# Patient Record
Sex: Male | Born: 1970 | Race: White | Hispanic: No | Marital: Married | Smoking: Never smoker
Health system: Southern US, Community
[De-identification: ages and names within clinical notes are randomized; demographics above are authoritative.]

## PROBLEM LIST (undated history)

## (undated) DIAGNOSIS — I780 Hereditary hemorrhagic telangiectasia: Secondary | ICD-10-CM

## (undated) HISTORY — DX: Hereditary hemorrhagic telangiectasia: I78.0

## (undated) HISTORY — PX: LUNG SURGERY: SHX703

## (undated) HISTORY — PX: BRAIN SURGERY: SHX531

---

## 2000-10-27 ENCOUNTER — Ambulatory Visit (HOSPITAL_BASED_OUTPATIENT_CLINIC_OR_DEPARTMENT_OTHER): Admission: RE | Admit: 2000-10-27 | Discharge: 2000-10-27 | Payer: Self-pay | Admitting: *Deleted

## 2003-05-22 ENCOUNTER — Encounter: Admission: RE | Admit: 2003-05-22 | Discharge: 2003-05-22 | Payer: Self-pay | Admitting: *Deleted

## 2003-06-06 ENCOUNTER — Encounter: Admission: RE | Admit: 2003-06-06 | Discharge: 2003-06-06 | Payer: Self-pay | Admitting: *Deleted

## 2003-07-12 ENCOUNTER — Encounter: Admission: RE | Admit: 2003-07-12 | Discharge: 2003-07-12 | Payer: Self-pay | Admitting: *Deleted

## 2003-10-31 ENCOUNTER — Ambulatory Visit (HOSPITAL_COMMUNITY): Admission: RE | Admit: 2003-10-31 | Discharge: 2003-11-01 | Payer: Self-pay | Admitting: Orthopaedic Surgery

## 2003-12-24 ENCOUNTER — Ambulatory Visit (HOSPITAL_COMMUNITY): Admission: RE | Admit: 2003-12-24 | Discharge: 2003-12-24 | Payer: Self-pay | Admitting: Family Medicine

## 2004-04-03 ENCOUNTER — Inpatient Hospital Stay (HOSPITAL_COMMUNITY): Admission: RE | Admit: 2004-04-03 | Discharge: 2004-04-09 | Payer: Self-pay | Admitting: Thoracic Surgery

## 2004-04-15 ENCOUNTER — Encounter: Admission: RE | Admit: 2004-04-15 | Discharge: 2004-04-15 | Payer: Self-pay | Admitting: Thoracic Surgery

## 2004-04-30 ENCOUNTER — Encounter: Admission: RE | Admit: 2004-04-30 | Discharge: 2004-04-30 | Payer: Self-pay | Admitting: Thoracic Surgery

## 2004-06-11 ENCOUNTER — Encounter: Admission: RE | Admit: 2004-06-11 | Discharge: 2004-06-11 | Payer: Self-pay | Admitting: Thoracic Surgery

## 2005-10-14 ENCOUNTER — Encounter: Admission: RE | Admit: 2005-10-14 | Discharge: 2005-10-14 | Payer: Self-pay | Admitting: Orthopaedic Surgery

## 2005-11-05 ENCOUNTER — Encounter: Admission: RE | Admit: 2005-11-05 | Discharge: 2005-11-05 | Payer: Self-pay | Admitting: Orthopaedic Surgery

## 2010-10-15 ENCOUNTER — Encounter: Payer: Self-pay | Admitting: Oncology

## 2011-06-18 DIAGNOSIS — Z8774 Personal history of (corrected) congenital malformations of heart and circulatory system: Secondary | ICD-10-CM | POA: Insufficient documentation

## 2012-02-24 ENCOUNTER — Ambulatory Visit (HOSPITAL_COMMUNITY)
Admission: RE | Admit: 2012-02-24 | Discharge: 2012-02-24 | Disposition: A | Discharge status: Home / Self Care | Payer: BC Managed Care – PPO | Source: Ambulatory Visit | Attending: Family Medicine | Admitting: Family Medicine

## 2012-02-24 ENCOUNTER — Ambulatory Visit (INDEPENDENT_AMBULATORY_CARE_PROVIDER_SITE_OTHER): Payer: BC Managed Care – PPO | Admitting: Family Medicine

## 2012-02-24 VITALS — BP 141/89 | HR 74 | Temp 98.5°F | Resp 16 | Ht 71.0 in | Wt 223.6 lb

## 2012-02-24 DIAGNOSIS — R51 Headache: Secondary | ICD-10-CM | POA: Insufficient documentation

## 2012-02-24 DIAGNOSIS — G510 Bell's palsy: Secondary | ICD-10-CM | POA: Insufficient documentation

## 2012-02-24 DIAGNOSIS — I780 Hereditary hemorrhagic telangiectasia: Secondary | ICD-10-CM | POA: Insufficient documentation

## 2012-02-24 DIAGNOSIS — I10 Essential (primary) hypertension: Secondary | ICD-10-CM

## 2012-02-24 MED ORDER — PREDNISONE 20 MG PO TABS: 80.0000 mg | ORAL_TABLET | Freq: Every day | ORAL | Status: DC

## 2012-02-24 MED ORDER — LACRI-LUBE S.O.P. OP OINT: TOPICAL_OINTMENT | OPHTHALMIC | Status: DC

## 2012-02-24 MED ORDER — VALACYCLOVIR HCL 1 G PO TABS: 1000.0000 mg | ORAL_TABLET | Freq: Three times a day (TID) | ORAL | Status: DC

## 2012-02-24 NOTE — Progress Notes (Signed)
Subjective:    Patient ID: Marcus Ball, male    DOB: [birth year], 41 y.o.   MRN: [id number]  HPI  Pt brought his daughter in today for eval.  While he was in the waiting room, he and his daughter started to notice that the right side of his face felt funny and his mouth was drooping. He was concerned he could be having an allergic rxn to something. He has no known allergies, has not taken any medicines today, has not tried any new or different foods today.  Unsure if change in taste - tongue feels odd. Has no h/o bell's palsy. 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 Yest he did have a recurrent spasm of his left eyelid and has a canker sore dev yest as well.  Soc Hx: Pt currently trying for IVF cycle w/ his wife Review of Systems  Constitutional: Negative.   HENT: Positive for mouth sores. Negative for ear pain, congestion, sore throat, facial swelling, trouble swallowing, neck pain, neck stiffness and voice change.   Eyes: Negative for photophobia, pain, discharge, redness, itching and visual disturbance.  Respiratory: Negative for shortness of breath.   Cardiovascular: Negative for chest pain.  Musculoskeletal: Negative for gait problem.  Neurological: Positive for facial asymmetry and numbness. Negative for dizziness, tremors, seizures, syncope, speech difficulty, light-headedness and headaches.  Hematological: Negative for adenopathy.      BP 141/89  Pulse 74  Temp 98.5 F (36.9 C) (Oral)  Resp 16  Ht 5\' 11"  (1.803 m)  Wt 223 lb 9.6 oz (101.424 kg)  BMI 31.19 kg/m2  SpO2 99%  Objective:   Physical Exam  Constitutional: He is oriented to person, place, and time. He appears well-developed and well-nourished. No distress.  HENT:  Head: Normocephalic and atraumatic.  Right Ear: External ear normal.  Left Ear: External ear normal.  Nose: Nose normal.  Mouth/Throat: Oropharynx is clear and  moist. No oropharyngeal exudate.  Eyes: Conjunctivae normal and EOM are normal. Pupils are equal, round, and reactive to light. Right eye exhibits no discharge. Left eye exhibits no discharge. No scleral icterus.  Neck: Normal range of motion. Neck supple. No thyromegaly present.  Cardiovascular: Normal rate, regular rhythm and normal heart sounds.   Pulmonary/Chest: Effort normal and breath sounds normal. No respiratory distress.  Lymphadenopathy:    He has no cervical adenopathy.  Neurological: He is alert and oriented to person, place, and time. He has normal strength. A cranial nerve deficit is present. Gait normal.       Pt having partial weakness and slight loss of sensation throughout all 3 branches of right trigeminal nerve - decreased forehead rise, can close eyelid completely but weak, right mouth drop.  No extremity weakness or numbness.  Skin: Skin is warm and dry. No rash noted. He is not diaphoretic.  Psychiatric: He has a normal mood and affect. His behavior is normal.          Assessment & Plan:  1. Bell's palsy - start high dose steroid and valtrex. Reviewed importance of appropriate eye care w/ artificial tears and taping.  Gave pt info on UTD handout. 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 Pt is leaving on vacation Fri, to return on Mon - flying, ok to go, RTC for repeat eval on Tues (6d).

## 2012-03-06 ENCOUNTER — Ambulatory Visit (INDEPENDENT_AMBULATORY_CARE_PROVIDER_SITE_OTHER): Payer: BC Managed Care – PPO | Admitting: Family Medicine

## 2012-03-06 VITALS — BP 126/83 | HR 82 | Temp 98.0°F | Resp 17 | Ht 71.0 in | Wt 219.0 lb

## 2012-03-06 DIAGNOSIS — G510 Bell's palsy: Secondary | ICD-10-CM

## 2012-03-06 DIAGNOSIS — R51 Headache: Secondary | ICD-10-CM

## 2012-03-06 DIAGNOSIS — J329 Chronic sinusitis, unspecified: Secondary | ICD-10-CM

## 2012-03-06 MED ORDER — CLARITHROMYCIN 500 MG PO TABS: 500.0000 mg | ORAL_TABLET | Freq: Two times a day (BID) | ORAL | Status: DC

## 2012-03-06 NOTE — Patient Instructions (Addendum)
Take the antibiotic for any tooth or sinus infection.  Please call your dentist tomorrow and ask for an evaluation asap.  If you are not better in the next few days please call or come back- Sooner if worse.

## 2012-03-06 NOTE — Progress Notes (Signed)
[facility] [address] [place] [address] [phone]  Date:  03/06/2012   Name:  Marcus Ball   DOB:  Dec 09, 1970   MRN:  [id number]  PCP:  No primary provider on file.    Chief Complaint: Follow-up and Facial Pain   History of Present Illness:  DENSON NICCOLI is a 41 y.o. very pleasant male patient who presents with the following:  He was seen here on 02/24/12 with drooping and sensation change on the right side of his face.    He has a history of surgery for brain and pulmonary AVM (herediatry hemorrhagic telangectasia).  He was started on steroids and valtrex at his last visit for Bell's palsy.  He also had a CT of his head:  CT HEAD WITHOUT CONTRAST  Technique: Contiguous axial images were obtained from the base of  the skull through the vertex without contrast.  Comparison: None.  Findings: There has been previous occipital craniotomy. There is  mild atrophy of the peripheral cerebellum on the right. No sign of  acute infarction, mass lesion, hemorrhage, hydrocephalus or extra-  axial collection. Sinuses, middle ears and mastoids are clear.  IMPRESSION:  Previous surgery in the right posterior fossa. Minimal atrophy of  the peripheral right cerebellum. Otherwise, the examination  appears normal. No acute finding.   He is here today to follow-up.  He notes that he developed pain behind his right ear in in his right cheek over the last couple of days.  It hurts to lie on the right side of his face.  It does not hurt to bend down.  It feels like an "ache."  He has completed the steroids- took for one week.  He finished the steroids 4 days ago.    He continues to have drooping and weakness in the right side of his face.  He is able to eat.  His eye is ok- he uses moisturizing ointment especially at night. No changes in his vision.  He has a mild ST, but no fever, no chills, no cough.  No GI symptoms.  He has been taking tylenol.  He does  see his dentist regularly but has not been in about one year Patient Active Problem List  Diagnosis  . Bell's palsy  . Osler-Weber-Rendu syndrome    No past medical history on file.  No past surgical history on file.  History  Substance Use Topics  . Smoking status: Never Smoker   . Smokeless tobacco: Not on file  . Alcohol Use: Not on file    No family history on file.  Allergies  Allergen Reactions  . Penicillins Hives  . Sulfa Antibiotics Hives    Medication list has been reviewed and updated.  Current Outpatient Prescriptions on File Prior to Visit  Medication Sig Dispense Refill  . Artificial Tear Ointment (LACRI-LUBE S.O.P.) OINT 1 application to eye q1 hrs prn  2 Tube  5  . allopurinol (ZYLOPRIM) 100 MG tablet Take 100 mg by mouth daily.      . predniSONE (DELTASONE) 20 MG tablet Take 4 tablets (80 mg total) by mouth daily.  28 tablet  0  . valACYclovir (VALTREX) 1000 MG tablet Take 1 tablet (1,000 mg total) by mouth 3 (three) times daily.  21 tablet  0    Review of Systems:  As per HPI- otherwise negative.   Physical Examination: Filed Vitals:   03/06/12 0930  BP: 126/83  Pulse: 82  Temp: 98  F (36.7 C)  Resp: 17   Filed Vitals:   03/06/12 0930  Height: 5\' 11"  (1.803 m)  Weight: 219 lb (99.338 kg)   Body mass index is 30.54 kg/(m^2). Ideal Body Weight: Weight in (lb) to have BMI = 25: 178.9   GEN: WDWN, NAD, Non-toxic, A & O x 3 HEENT: Atraumatic, Normocephalic. Neck supple. No masses, No LAD. Bilateral TM wnl, oropharynx normal.  PEERL,EOMI.   Drooping and weakness of the right side of the face including the forehead.  Not able to completely close the eye.  No rash or lesion.  Right cheek appears swollen, although facial asymmetry makes this determination more difficult.  He is tender over his anterior right cheekbone.  No specific dental tenderness  Ears and Nose: No external deformity. CV: RRR, No M/G/R. No JVD. No thrill. No extra heart  sounds. PULM: CTA B, no wheezes, crackles, rhonchi. No retractions. No resp. distress. No accessory muscle use EXTR: No c/c/e NEURO Normal gait.  PSYCH: Normally interactive. Conversant. Not depressed or anxious appearing.  Calm demeanor.    Assessment and Plan: 1. Facial pain  clarithromycin (BIAXIN) 500 MG tablet  2. Sinusitis  clarithromycin (BIAXIN) 500 MG tablet  3. Bell's palsy     Chrstopher has Bell's Palsy.  He is not improving yet, but is hopeful that he will be better soon.  Today's issue actually seems to be unrelated, and I am suspicious of a dental problem. He has tenderness and swelling of his cheek that suggests a dental infection.  Will treat with biaxin, and he will see is dentist asap.  If he is cleared by his dentist and not better he is to call me right away  Meds ordered this encounter  Medications  . clarithromycin (BIAXIN) 500 MG tablet    Sig: Take 1 tablet (500 mg total) by mouth 2 (two) times daily.    Dispense:  20 tablet    Refill:  0     COPLAND,JESSICA, MD

## 2012-10-30 IMAGING — CT CT HEAD W/O CM
1 series · 16 of 30 positions shown, 20 images · non-contrast
Comparison: None.

CLINICAL DATA: Headache.  Right-sided Bell's palsy.  Previous
surgery for arteriovenous malformation.

CT HEAD WITHOUT CONTRAST
TECHNIQUE: Contiguous axial images were obtained from the base of
the skull through the vertex without contrast.

[Series 2: head routine 4.8 h37s · axial · 0.50mm/px · z∈[+1099,+1230]mm · 16 of 30 slices shown, 20 images]
[im 2/30  brain]
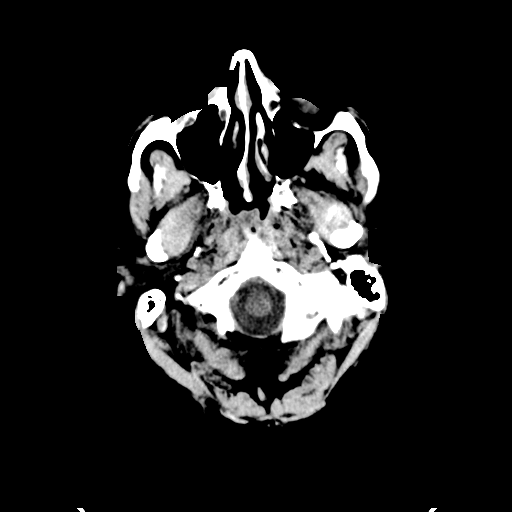
[im 2/30  bone]
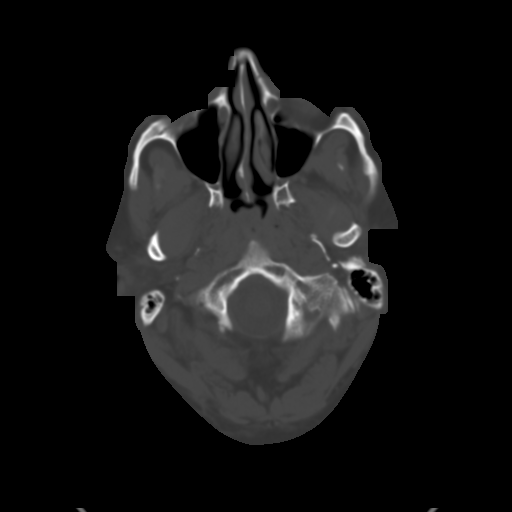
[im 4/30  brain]
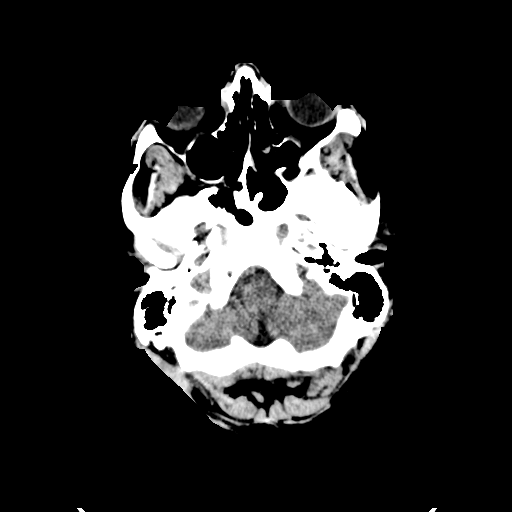
[im 6/30  brain]
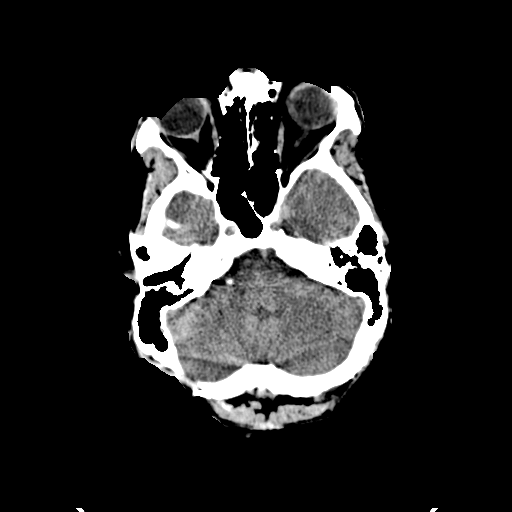
[im 8/30  brain]
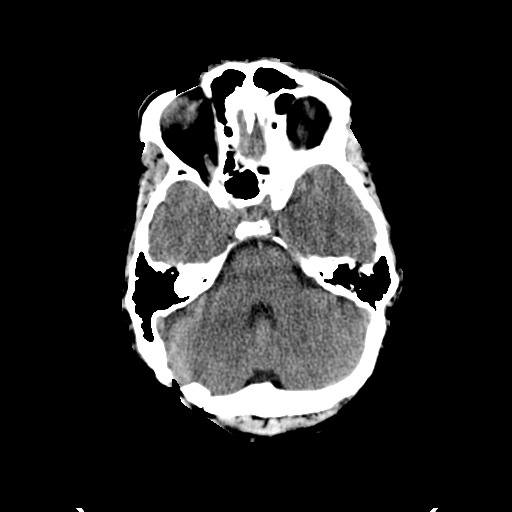
[im 9/30  brain]
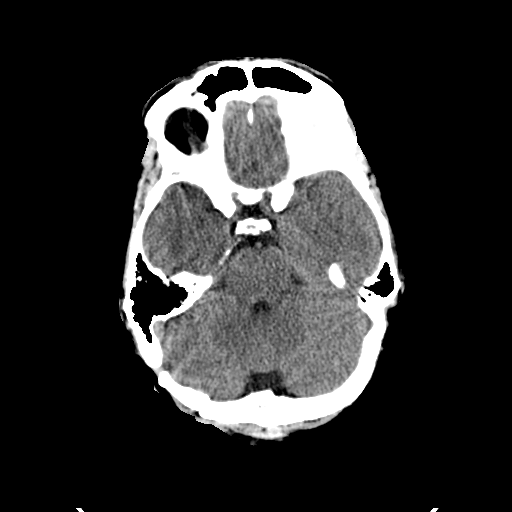
[im 9/30  bone]
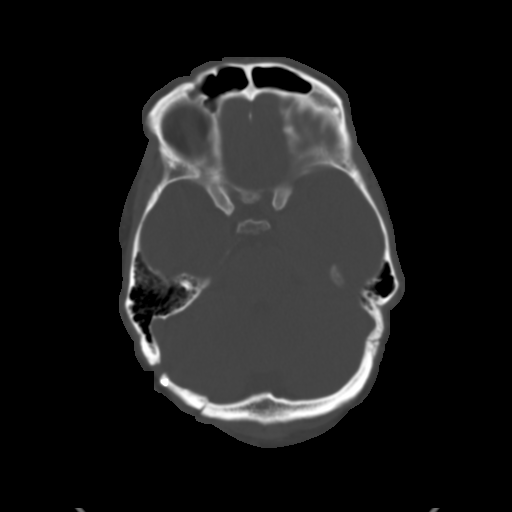
[im 11/30  brain]
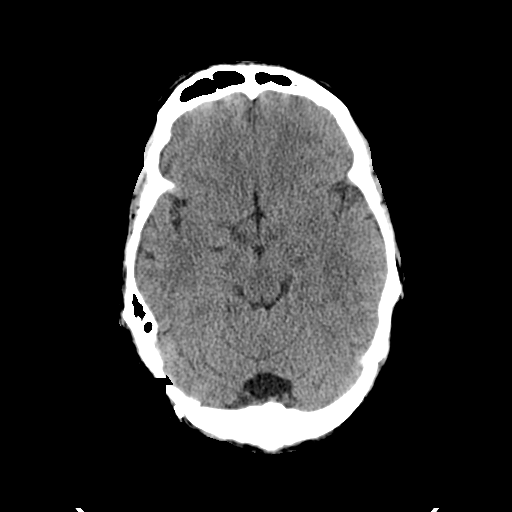
[im 13/30  brain]
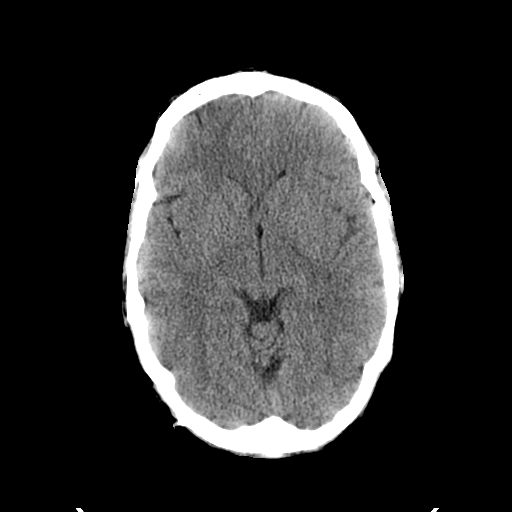
[im 15/30  brain]
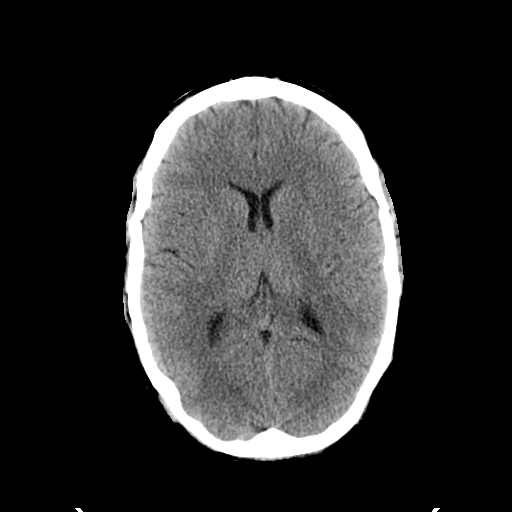
[im 16/30  brain]
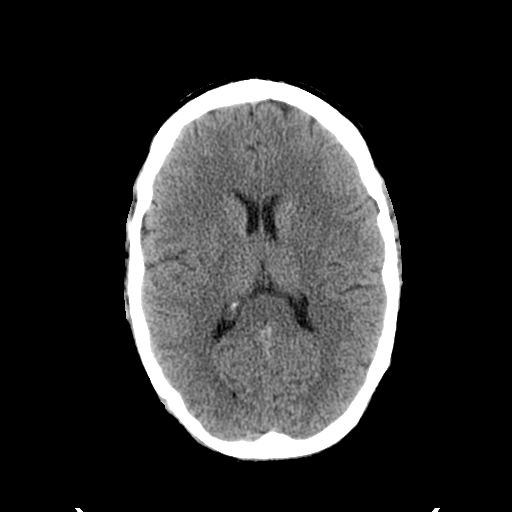
[im 16/30  bone]
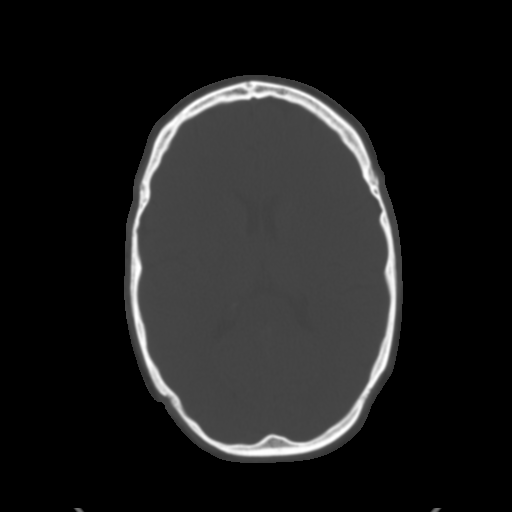
[im 18/30  brain]
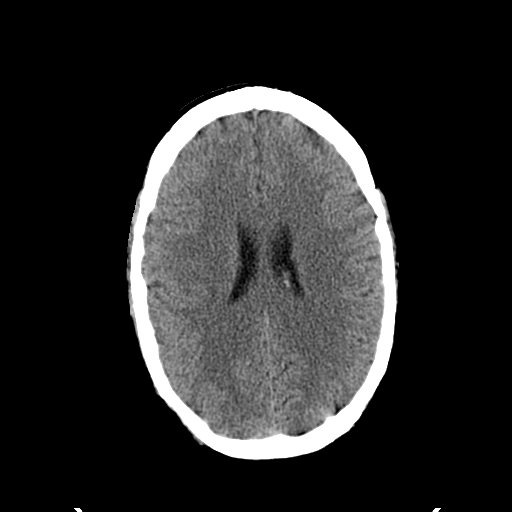
[im 20/30  brain]
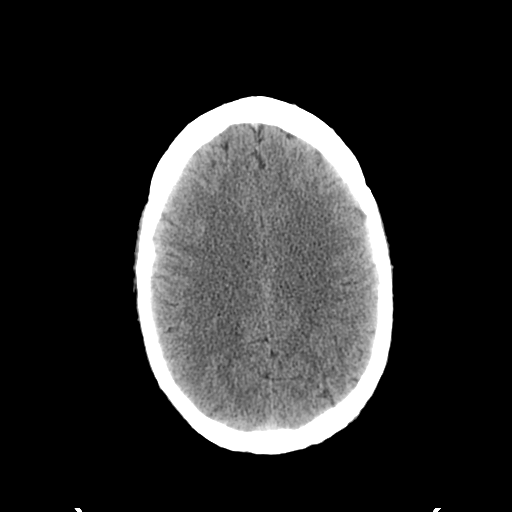
[im 22/30  brain]
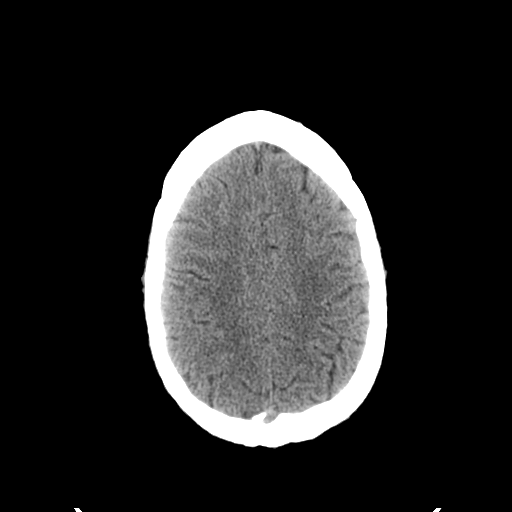
[im 23/30  brain]
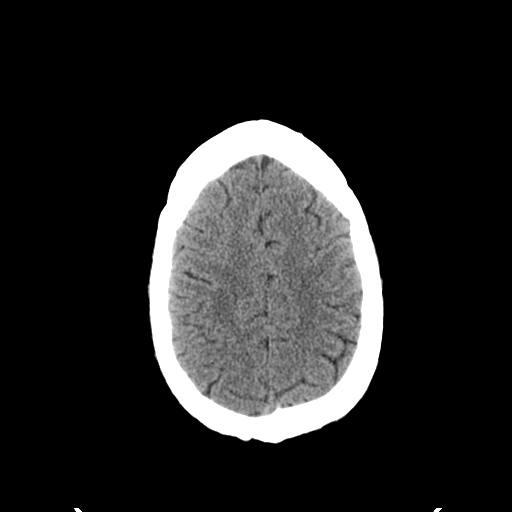
[im 23/30  bone]
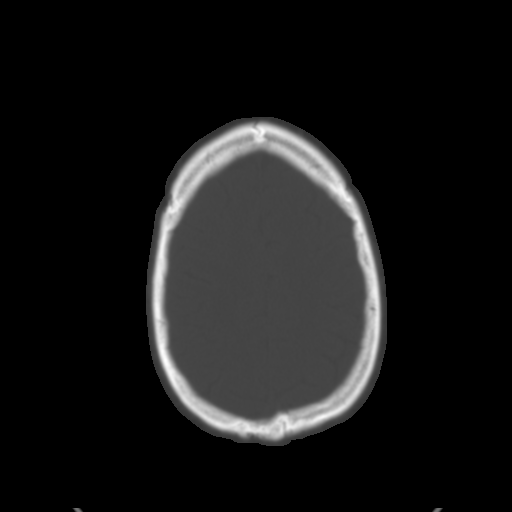
[im 25/30  brain]
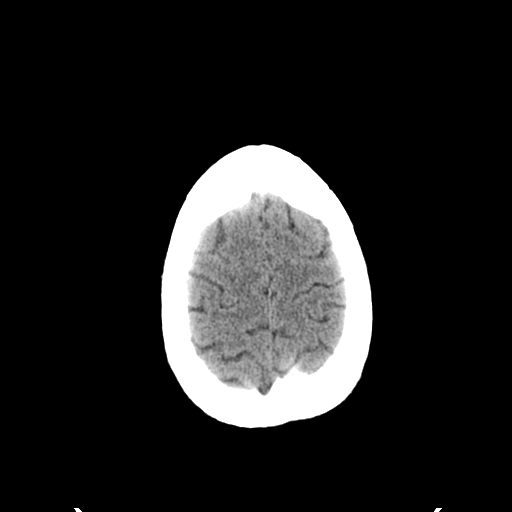
[im 27/30  brain]
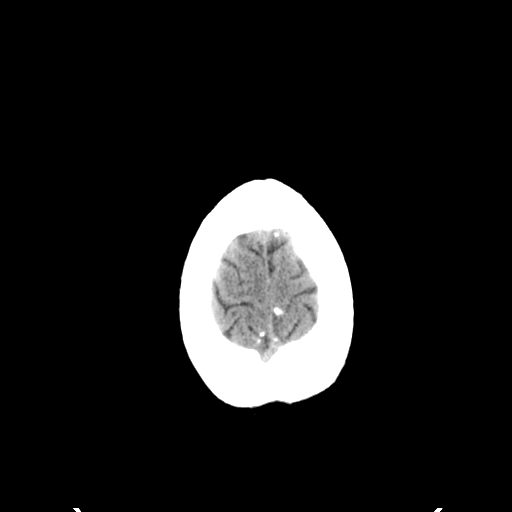
[im 29/30  brain]
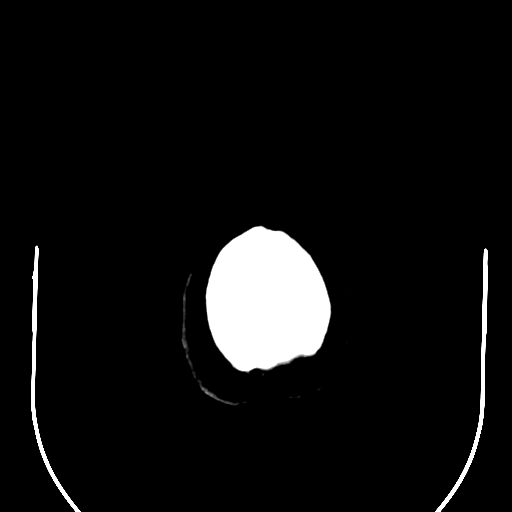

[16 of 30 positions shown; findings below may reference images not displayed]

FINDINGS: There has been previous occipital craniotomy.  There is
mild atrophy of the peripheral cerebellum on the right.  No sign of
acute infarction, mass lesion, hemorrhage, hydrocephalus or extra-
axial collection.  Sinuses, middle ears and mastoids are clear.
IMPRESSION: Previous surgery in the right posterior fossa.  Minimal atrophy of
the peripheral right cerebellum.  Otherwise, the examination
appears normal.  No acute finding.

## 2013-04-17 ENCOUNTER — Ambulatory Visit (INDEPENDENT_AMBULATORY_CARE_PROVIDER_SITE_OTHER): Payer: BC Managed Care – PPO | Admitting: Physician Assistant

## 2013-04-17 VITALS — BP 110/76 | HR 101 | Temp 100.3°F | Resp 18 | Ht 71.0 in | Wt 216.0 lb

## 2013-04-17 DIAGNOSIS — R509 Fever, unspecified: Secondary | ICD-10-CM

## 2013-04-17 DIAGNOSIS — R05 Cough: Secondary | ICD-10-CM

## 2013-04-17 DIAGNOSIS — J111 Influenza due to unidentified influenza virus with other respiratory manifestations: Secondary | ICD-10-CM

## 2013-04-17 DIAGNOSIS — J101 Influenza due to other identified influenza virus with other respiratory manifestations: Secondary | ICD-10-CM

## 2013-04-17 DIAGNOSIS — R059 Cough, unspecified: Secondary | ICD-10-CM

## 2013-04-17 LAB — POCT INFLUENZA A/B
Influenza A, POC: POSITIVE
Influenza B, POC: NEGATIVE

## 2013-04-17 MED ORDER — HYDROCODONE-HOMATROPINE 5-1.5 MG/5ML PO SYRP: 5.0000 mL | ORAL_SOLUTION | Freq: Three times a day (TID) | ORAL | Status: DC | PRN

## 2013-04-17 MED ORDER — OSELTAMIVIR PHOSPHATE 75 MG PO CAPS: 75.0000 mg | ORAL_CAPSULE | Freq: Two times a day (BID) | ORAL | Status: DC

## 2013-04-17 NOTE — Progress Notes (Signed)
   Subjective:    Patient ID: Marcus Ball, male    DOB: [birth year], 42 y.o.   MRN: [id number]  Fever  Associated symptoms include congestion, coughing, headaches and a sore throat. Pertinent negatives include no abdominal pain, nausea, vomiting or wheezing.  Sore Throat  Associated symptoms include congestion, coughing and headaches. Pertinent negatives include no abdominal pain, shortness of breath or vomiting.  Sinus Problem Associated symptoms include chills, congestion, coughing, headaches and a sore throat. Pertinent negatives include no shortness of breath or sinus pressure.   42 year old male presents for evaluation of nasal congestion, sore throat, cough, fevers, body aches, and headache.  Symptoms started suddenly on 04/15/13.  Admits his daughter and wife both have similar illnesses but have not been tested for the flu. He has been taking tylenol and robitussin which helps some.  Denies SOB, wheezing, nausea, vomiting, otalgia, sinus pain, or abdominal pain.  He is concerned because he has an 64 week old baby at home.  Did have his flu shot 2 months ago.     Review of Systems  Constitutional: Positive for fever, chills and fatigue.  HENT: Positive for congestion, rhinorrhea and sore throat. Negative for sinus pressure.   Respiratory: Positive for cough. Negative for shortness of breath and wheezing.   Gastrointestinal: Negative for nausea, vomiting and abdominal pain.  Neurological: Positive for headaches.       Objective:   Physical Exam  Constitutional: He is oriented to person, place, and time. He appears well-developed and well-nourished.  HENT:  Head: Normocephalic and atraumatic.  Right Ear: Hearing, tympanic membrane, external ear and ear canal normal.  Left Ear: Hearing, tympanic membrane, external ear and ear canal normal.  Mouth/Throat: Uvula is midline, oropharynx is clear and moist and mucous membranes are normal.  Eyes: Conjunctivae are normal.    Cardiovascular: Normal rate, regular rhythm and normal heart sounds.   Pulmonary/Chest: Effort normal and breath sounds normal.  Neurological: He is alert and oriented to person, place, and time.  Psychiatric: He has a normal mood and affect. His behavior is normal. Judgment and thought content normal.     Results for orders placed in visit on 04/17/13  POCT INFLUENZA A/B      Result Value Range   Influenza A, POC Positive     Influenza B, POC Negative          Assessment & Plan:  Fever, unspecified - Plan: POCT Influenza A/B, oseltamivir (TAMIFLU) 75 MG capsule  Cough - Plan: HYDROcodone-homatropine (HYCODAN) 5-1.5 MG/5ML syrup  Influenza A - Plan: oseltamivir (TAMIFLU) 75 MG capsule  Even though he has been symptomatic for just over 48 hours, will go ahead and start Tamiflu Tamiflu 75 mg bid x 5 days Good handwashing and wear mask at home Follow up if symptoms worsen or fail to improve.

## 2013-04-27 ENCOUNTER — Telehealth: Payer: Self-pay

## 2013-04-27 NOTE — Telephone Encounter (Signed)
I am sorry, but I cannot call in an antibiotic without evaluating the patient. I would send this to the provider if she was in town, however she is not so we must go by what is safest for the patient which is not just blindly throw medication as someone without evaluating them.

## 2013-04-27 NOTE — Telephone Encounter (Signed)
Thank you, I agree, advised patient to return to clinic.

## 2013-04-27 NOTE — Telephone Encounter (Signed)
Note indicates follow up is needed if not improving, but patient states he was advised to call if he gets sinus symptoms, which he has. Please advise.

## 2013-04-27 NOTE — Telephone Encounter (Signed)
Patient was seen here and was told he would probably develop a sinus infection.  Which he did and is requesting meds to clear it up.   [facility] - [address]    [phone]

## 2013-06-02 ENCOUNTER — Ambulatory Visit (INDEPENDENT_AMBULATORY_CARE_PROVIDER_SITE_OTHER): Payer: BC Managed Care – PPO | Admitting: Family Medicine

## 2013-06-02 VITALS — BP 110/72 | HR 86 | Temp 101.9°F | Resp 16 | Ht 72.0 in | Wt 224.0 lb

## 2013-06-02 DIAGNOSIS — D72829 Elevated white blood cell count, unspecified: Secondary | ICD-10-CM

## 2013-06-02 DIAGNOSIS — J029 Acute pharyngitis, unspecified: Secondary | ICD-10-CM

## 2013-06-02 DIAGNOSIS — R059 Cough, unspecified: Secondary | ICD-10-CM

## 2013-06-02 DIAGNOSIS — R05 Cough: Secondary | ICD-10-CM

## 2013-06-02 DIAGNOSIS — R6889 Other general symptoms and signs: Secondary | ICD-10-CM

## 2013-06-02 LAB — POCT CBC
GRANULOCYTE PERCENT: 87.5 % — AB (ref 37–80)
HCT, POC: 45.4 % (ref 43.5–53.7)
Hemoglobin: 14.7 g/dL (ref 14.1–18.1)
LYMPH, POC: 1.3 (ref 0.6–3.4)
MCH: 28.5 pg (ref 27–31.2)
MCHC: 32.4 g/dL (ref 31.8–35.4)
MCV: 88.2 fL (ref 80–97)
MID (CBC): 1 — AB (ref 0–0.9)
MPV: 11.2 fL (ref 0–99.8)
PLATELET COUNT, POC: 182 10*3/uL (ref 142–424)
POC Granulocyte: 16.3 — AB (ref 2–6.9)
POC LYMPH %: 7 % — AB (ref 10–50)
POC MID %: 5.5 % (ref 0–12)
RBC: 5.15 M/uL (ref 4.69–6.13)
RDW, POC: 13.7 %
WBC: 18.6 10*3/uL — AB (ref 4.6–10.2)

## 2013-06-02 LAB — POCT INFLUENZA A/B
INFLUENZA A, POC: NEGATIVE
INFLUENZA B, POC: NEGATIVE

## 2013-06-02 LAB — POCT RAPID STREP A (OFFICE): Rapid Strep A Screen: NEGATIVE

## 2013-06-02 MED ORDER — AZITHROMYCIN 250 MG PO TABS: ORAL_TABLET | ORAL | Status: AC

## 2013-06-02 MED ORDER — GUAIFENESIN ER 1200 MG PO TB12: 1.0000 | ORAL_TABLET | Freq: Two times a day (BID) | ORAL | Status: AC

## 2013-06-02 NOTE — Progress Notes (Signed)
Subjective:    Patient ID: Marcus Ball, male    DOB: 07-13-1970, 43 y.o.   MRN: [id number]  HPI Pt presents to clinic with 24hs of sore throat and high fevers with headaches and myalgias.  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  He had the flu in December and this feels just like it.  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  HENT: Positive for congestion, postnasal drip, rhinorrhea (clear) and sore throat.   Respiratory: Positive for cough (minimal - dry). Negative for shortness of breath and wheezing.   Musculoskeletal: Positive for myalgias.  Neurological: Positive for headaches.       Objective:   Physical Exam  Vitals reviewed. Constitutional: He is oriented to person, place, and time. He appears well-developed and well-nourished.  HENT:  Head: Normocephalic and atraumatic.  Right Ear: Hearing, tympanic membrane, external ear and ear canal normal.  Left Ear: Hearing, tympanic membrane, external ear and ear canal normal.  Nose: Nose normal.  Mouth/Throat: Uvula is midline, oropharynx is clear and moist and mucous membranes are normal.  Eyes: Conjunctivae are normal.  Neck: Normal range of motion.  Lymphadenopathy:    He has cervical adenopathy (AC enlarged and TTP).  Neurological: He is alert and oriented to person, place, and time.  Skin: Skin is warm and dry.  Psychiatric: He has a normal mood and affect. His behavior is normal. Judgment and thought content normal.   Results for orders placed in visit on 06/02/13  POCT INFLUENZA A/B      Result Value Range   Influenza A, POC Negative     Influenza B, POC Negative    POCT CBC      Result Value Range   WBC 18.6 (*) 4.6 - 10.2 K/uL   Lymph, poc 1.3  0.6 - 3.4   POC LYMPH PERCENT 7.0 (*)  10 - 50 %L   MID (cbc) 1.0 (*) 0 - 0.9   POC MID % 5.5  0 - 12 %M   POC Granulocyte 16.3 (*) 2 - 6.9   Granulocyte percent 87.5 (*) 37 - 80 %G   RBC 5.15  4.69 - 6.13 M/uL   Hemoglobin 14.7  14.1 - 18.1 g/dL   HCT, POC 40.9  81.1 - 53.7 %   MCV 88.2  80 - 97 fL   MCH, POC 28.5  27 - 31.2 pg   MCHC 32.4  31.8 - 35.4 g/dL   RDW, POC 91.4     Platelet Count, POC 182  142 - 424 K/uL   MPV 11.2  0 - 99.8 fL  POCT RAPID STREP A (OFFICE)      Result Value Range   Rapid Strep A Screen Negative  Negative         Assessment & Plan:  Flu-like symptoms - Plan: POCT Influenza A/B, POCT CBC, POCT rapid strep A  Sore throat - Plan: Culture, Group A Strep, azithromycin (ZITHROMAX Z-PAK) 250 MG tablet  Leukocytosis, unspecified - Plan: Culture, Group A Strep, azithromycin (ZITHROMAX Z-PAK) 250 MG tablet, Guaifenesin (MUCINEX MAXIMUM STRENGTH) 1200 MG TB12  Cough - Plan: azithromycin (ZITHROMAX Z-PAK) 250 MG tablet  I am unsure of exactly what is going on with this patient.  Flu is doubtful  due to elevated white count but I do not have a source for his infection. I am going to place the patient on Zithromax to cover for strep and atypical bacteria in the chest.  He is going to recheck in 48h if not better sooner if worse.  We discussed ways to decrease transmission at home with his family.  D.w Dr Patsy [place]ager[place].  Benny LennertSarah Weber PA-C 06/02/2013 8:23 PM

## 2013-06-05 LAB — CULTURE, GROUP A STREP: Organism ID, Bacteria: NORMAL

## 2013-06-06 ENCOUNTER — Telehealth: Payer: Self-pay | Admitting: Family Medicine

## 2013-06-06 DIAGNOSIS — D72829 Elevated white blood cell count, unspecified: Secondary | ICD-10-CM

## 2013-06-06 NOTE — Telephone Encounter (Signed)
Message copied by Pearline CablesOPLAND, JESSICA C on Tue Jun 06, 2013 11:33 AM ------      Message from: Abbe AmsterdamOPLAND, JESSICA C      Created: Fri Jun 02, 2013  9:48 PM      Regarding: call to check on pt.         Also ordered a CBC as a future for him to recheck if OV is not needed ------

## 2013-06-06 NOTE — Telephone Encounter (Signed)
Called to check on him- he is doing overall better, no more fever and ST is better. However his sinuses are still painful.  Advised him that I am concerned that his white cell count was so high and would like to make sure it returns to normal.  Placed order for a lab visit only and asked if he could come by for a CBC in a few weeks.  He agreed.

## 2013-06-16 ENCOUNTER — Telehealth: Payer: Self-pay

## 2013-06-16 DIAGNOSIS — D72829 Elevated white blood cell count, unspecified: Secondary | ICD-10-CM

## 2013-06-16 NOTE — Telephone Encounter (Signed)
Sure, I added this lab.  Could you ask if there is any particular reason he wants this test?  The problem was with his white cell count, not red.  In any case I added the test JC

## 2013-06-16 NOTE — Telephone Encounter (Signed)
Pt would like to add a ferratin level to the blood work. He is coming in next week to have it retested. Can we add this test?

## 2013-06-16 NOTE — Telephone Encounter (Signed)
Pt has questions regarding his future order for labs. [facility]# [phone]

## 2013-06-18 NOTE — Telephone Encounter (Signed)
Spoke with pt, he has HHT and he wants his iron to be checked.

## 2014-06-11 ENCOUNTER — Encounter: Payer: Self-pay | Admitting: Family Medicine

## 2015-05-23 ENCOUNTER — Ambulatory Visit (INDEPENDENT_AMBULATORY_CARE_PROVIDER_SITE_OTHER): Payer: BC Managed Care – PPO | Admitting: Family Medicine

## 2015-05-23 VITALS — BP 128/82 | HR 96 | Temp 100.1°F | Ht 71.0 in | Wt 233.0 lb

## 2015-05-23 DIAGNOSIS — J029 Acute pharyngitis, unspecified: Secondary | ICD-10-CM

## 2015-05-23 DIAGNOSIS — R509 Fever, unspecified: Secondary | ICD-10-CM

## 2015-05-23 DIAGNOSIS — I780 Hereditary hemorrhagic telangiectasia: Secondary | ICD-10-CM | POA: Diagnosis not present

## 2015-05-23 DIAGNOSIS — D72829 Elevated white blood cell count, unspecified: Secondary | ICD-10-CM

## 2015-05-23 LAB — POCT CBC
Granulocyte percent: 81.7 %G — AB (ref 37–80)
HCT, POC: 42.3 % — AB (ref 43.5–53.7)
Hemoglobin: 14.5 g/dL (ref 14.1–18.1)
LYMPH, POC: 1.5 (ref 0.6–3.4)
MCH: 28.3 pg (ref 27–31.2)
MCHC: 34.2 g/dL (ref 31.8–35.4)
MCV: 82.6 fL (ref 80–97)
MID (CBC): 0.9 (ref 0–0.9)
MPV: 8.8 fL (ref 0–99.8)
PLATELET COUNT, POC: 151 10*3/uL (ref 142–424)
POC Granulocyte: 10.7 — AB (ref 2–6.9)
POC LYMPH %: 11.6 % (ref 10–50)
POC MID %: 6.7 % (ref 0–12)
RBC: 5.12 M/uL (ref 4.69–6.13)
RDW, POC: 14.2 %
WBC: 13.1 10*3/uL — AB (ref 4.6–10.2)

## 2015-05-23 LAB — POCT RAPID STREP A (OFFICE): Rapid Strep A Screen: NEGATIVE

## 2015-05-23 MED ORDER — AZITHROMYCIN 250 MG PO TABS: ORAL_TABLET | ORAL | Status: DC

## 2015-05-23 NOTE — Progress Notes (Signed)
Subjective:    Patient ID: Marcus Ball, male    DOB: [birth year], 45 y.o.   MRN: [id number] By signing my name below, I, Javier Docker, attest that this documentation has been prepared under the direction and in the presence of Meredith Staggers, MD. Electronically Signed: Javier Docker, ER Scribe. 05/23/2015. 8:51 AM.  Chief Complaint  Patient presents with  . Sore Throat    x 3 days  . Ear Pain  . Fever    100.1 at triage    HPI HPI Comments: Marcus Ball is a 45 y.o. male with a hx of hereditary hemorrhagic telangiectasia who presents to [facility] complaining of right ear pressure and pain, right jaw pain, sinus pressure, sinus congestion, rhinorrhea and sore throat for the last three days. He states his worst symptom is the ear and throat pain. He endorses sick contacts, daughter, with fever, rash and sore throat. He had a nose bleed this morning and has nosebleeds regularly, which is unchanged from baseline. He denies swelling in legs or cough.     Patient Active Problem List   Diagnosis Date Noted  . Bell's palsy 02/24/2012  . 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 HHT (hereditary hemorrhagic telangiectasia) (HCC)    Past Surgical History  Procedure Laterality Date  . Brain surgery    . Lung surgery     Allergies  Allergen Reactions  . Penicillins Hives  . Sulfa Antibiotics Hives   Prior to Admission medications   Medication Sig Start Date End Date Taking? Authorizing Provider  allopurinol (ZYLOPRIM) 100 MG tablet Take 100 mg by mouth daily.   Yes Historical Provider, MD  HYDROcodone-homatropine (HYCODAN) 5-1.5 MG/5ML syrup Take 5 mLs by mouth every 8 (eight) hours as needed for cough. Patient not taking: Reported on 05/23/2015 04/17/13   Nelva Nay, PA-C   Social History   Social History  . Marital Status: Single    Spouse Name: N/A  . Number of Children: N/A  . Years of Education: N/A    Occupational History  . Not on file.   Social History Main Topics  . Smoking status: Never Smoker   . Smokeless tobacco: Not on file  . Alcohol Use: Not on file  . Drug Use: Not on file  . Sexual Activity: Not on file   Other Topics Concern  . Not on file   Social History Narrative    Review of Systems  Constitutional: Positive for fever and chills.  HENT: Positive for congestion, ear pain, nosebleeds, rhinorrhea, sinus pressure and sore throat.   Respiratory: Negative for cough and shortness of breath.   Cardiovascular: Negative for leg swelling.      Objective:  BP 128/82 mmHg  Pulse 96  Temp(Src) 100.1 F (37.8 C) (Oral)  Ht 5\' 11"  (1.803 m)  Wt 233 lb (105.688 kg)  BMI 32.51 kg/m2  SpO2 98%  Physical Exam  Constitutional: He is oriented to person, place, and time. He appears well-developed and well-nourished. No distress.  HENT:  Head: Normocephalic and atraumatic.  Right Ear: Tympanic membrane, external ear and ear canal normal.  Left Ear: Tympanic membrane, external ear and ear canal normal.  Nose: No rhinorrhea.  Mouth/Throat: Oropharynx is clear and moist and mucous membranes are normal. No oropharyngeal exudate or posterior oropharyngeal erythema.  Erythema in right tonsil and right posterior oropharynx, no exudates. Dried blood in the nares, small amount of dried blood on the  septum, no active bleeding. Few scattered telangiectasia on the uvula.   Eyes: Conjunctivae are normal. Pupils are equal, round, and reactive to light.  Neck: Neck supple.  Slight tenderness on the right submandibular tenderness without significant enlargement.  Cardiovascular: Normal rate, regular rhythm, normal heart sounds and intact distal pulses.   No murmur heard. Pulmonary/Chest: Effort normal and breath sounds normal. No respiratory distress. He has no wheezes. He has no rhonchi. He has no rales.  Abdominal: Soft. There is no tenderness.  Musculoskeletal: Normal range of  motion.  Lymphadenopathy:    He has no cervical adenopathy.  Neurological: He is alert and oriented to person, place, and time. Coordination normal.  Skin: Skin is warm and dry. No rash noted. He is not diaphoretic.  Psychiatric: He has a normal mood and affect. His behavior is normal.  Nursing note and vitals reviewed.   Results for orders placed or performed in visit on 05/23/15  POCT CBC  Result Value Ref Range   WBC 13.1 (A) 4.6 - 10.2 K/uL   Lymph, poc 1.5 0.6 - 3.4   POC LYMPH PERCENT 11.6 10 - 50 %L   MID (cbc) 0.9 0 - 0.9   POC MID % 6.7 0 - 12 %M   POC Granulocyte 10.7 (A) 2 - 6.9   Granulocyte percent 81.7 (A) 37 - 80 %G   RBC 5.12 4.69 - 6.13 M/uL   Hemoglobin 14.5 14.1 - 18.1 g/dL   HCT, POC 16.142.3 (A) 09.643.5 - 53.7 %   MCV 82.6 80 - 97 fL   MCH, POC 28.3 27 - 31.2 pg   MCHC 34.2 31.8 - 35.4 g/dL   RDW, POC 04.514.2 %   Platelet Count, POC 151 142 - 424 K/uL   MPV 8.8 0 - 99.8 fL  POCT rapid strep A  Result Value Ref Range   Rapid Strep A Screen Negative Negative       Assessment & Plan:   Marcus Ball is a 45 y.o. male Sore throat - Plan: POCT rapid strep A, Culture, Group A Strep, azithromycin (ZITHROMAX) 250 MG tablet  Fever, unspecified - Plan: POCT rapid strep A, azithromycin (ZITHROMAX) 250 MG tablet  Hereditary hemorrhagic telangiectasia (HCC) - Plan: POCT CBC, IBC Panel  Leukocytosis - Plan: azithromycin (ZITHROMAX) 250 MG tablet    History of HHT. Few telangiectasias were seen in the posterior oropharynx, no active bleeding and no active bleeding seen in the nose. Fever, tender lymph nodes, erythematous tonsil, possible false negative strep especially with leukocytosis,  Versus viral illness. Will cover with azithromycin due to penicillin allergy. Throat culture pending.   Symptomatic care discussed  Advised [facility] the HHT. Hemoglobin was stable in the office, will send iron panel testing. RTC  precautions discussed.  Meds ordered this encounter  Medications  . azithromycin (ZITHROMAX) 250 MG tablet    Sig: Take 2 pills by mouth on day 1, then 1 pill by mouth per day on days 2 through 5.    Dispense:  6 tablet    Refill:  0   Patient Instructions  I will check iron testing, and blood counts were printed on your paperwork. Recommend you [facility] to be [facility] as it has been a number of years. They can determine if other routine testing is needed.   Take Tylenol for fever, sore throat and ear pain. Tea with honey, Cepacol cough drops, and  fluids.  Start azithromycin for possible false negative strep test are bacterial infection. I will check a throat culture and will let you know the results of that. Return to the clinic or go to the nearest emergency room if any of your symptoms worsen or new symptoms occur.  Sore Throat A sore throat is pain, burning, irritation, or scratchiness of the throat. There is often pain or tenderness when swallowing or talking. A sore throat may be accompanied by other symptoms, such as coughing, sneezing, fever, and swollen neck glands. A sore throat is often the first sign of another sickness, such as a cold, flu, strep throat, or mononucleosis (commonly known as mono). Most sore throats go away without medical treatment. CAUSES  The most common causes of a sore throat include:  A viral infection, such as a cold, flu, or mono.  A bacterial infection, such as strep throat, tonsillitis, or whooping cough.  Seasonal allergies.  Dryness in the air.  Irritants, such as smoke or pollution.  Gastroesophageal reflux disease (GERD). HOME CARE INSTRUCTIONS   Only take over-the-counter medicines as directed by your caregiver.  Drink enough fluids to keep your urine clear or pale yellow.  Rest as needed.  Try using throat sprays, lozenges, or sucking on hard candy to ease any pain (if older than 4 years or as directed).  Sip  warm liquids, such as broth, herbal tea, or warm water with honey to relieve pain temporarily. You may also eat or drink cold or frozen liquids such as frozen ice pops.  Gargle with salt water (mix 1 tsp salt with 8 oz of water).  Do not smoke and avoid secondhand smoke.  Put a cool-mist humidifier in your bedroom at night to moisten the air. You can also turn on a hot shower and sit in the bathroom with the door closed for 5-10 minutes. SEEK IMMEDIATE MEDICAL CARE IF:  You have difficulty breathing.  You are unable to swallow fluids, soft foods, or your saliva.  You have increased swelling in the throat.  Your sore throat does not get better in 7 days.  You have nausea and vomiting.  You have a fever or persistent symptoms for more than 2-3 days.  You have a fever and your symptoms suddenly get worse. MAKE SURE YOU:   Understand these instructions.  Will watch your condition.  Will get help right away if you are not doing well or get worse.   This information is not intended to replace advice given to you by your health care provider. Make sure you discuss any questions you have with your health care provider.   Document Released: 06/11/2004 Document Revised: 05/25/2014 Document Reviewed: 01/10/2012 Elsevier Interactive Patient Education [employer]       I personally performed the services described in this documentation, which was scribed in my presence. The recorded information has been reviewed and considered, and addended by me as needed.

## 2015-05-23 NOTE — Patient Instructions (Addendum)
I will check iron testing, and blood counts were printed on your paperwork. Recommend you [facility] to [facility] as it has been a number of years. They can determine if other routine testing is needed.   Take Tylenol for fever, sore throat and ear pain. Tea with honey, Cepacol cough drops, and fluids.  Start azithromycin for possible false negative strep test are bacterial infection. I will check a throat culture and will let you know the results of that. Return to the clinic or go to the nearest emergency room if any of your symptoms worsen or new symptoms occur.  Sore Throat A sore throat is pain, burning, irritation, or scratchiness of the throat. There is often pain or tenderness when swallowing or talking. A sore throat may be accompanied by other symptoms, such as coughing, sneezing, fever, and swollen neck glands. A sore throat is often the first sign of another sickness, such as a cold, flu, strep throat, or mononucleosis (commonly known as mono). Most sore throats go away without medical treatment. CAUSES  The most common causes of a sore throat include:  A viral infection, such as a cold, flu, or mono.  A bacterial infection, such as strep throat, tonsillitis, or whooping cough.  Seasonal allergies.  Dryness in the air.  Irritants, such as smoke or pollution.  Gastroesophageal reflux disease (GERD). HOME CARE INSTRUCTIONS   Only take over-the-counter medicines as directed by your caregiver.  Drink enough fluids to keep your urine clear or pale yellow.  Rest as needed.  Try using throat sprays, lozenges, or sucking on hard candy to ease any pain (if older than 4 years or as directed).  Sip warm liquids, such as broth, herbal tea, or warm water with honey to relieve pain temporarily. You may also eat or drink cold or frozen liquids such as frozen ice pops.  Gargle with salt water (mix 1 tsp salt with 8 oz of water).  Do not smoke and avoid secondhand  smoke.  Put a cool-mist humidifier in your bedroom at night to moisten the air. You can also turn on a hot shower and sit in the bathroom with the door closed for 5-10 minutes. SEEK IMMEDIATE MEDICAL CARE IF:  You have difficulty breathing.  You are unable to swallow fluids, soft foods, or your saliva.  You have increased swelling in the throat.  Your sore throat does not get better in 7 days.  You have nausea and vomiting.  You have a fever or persistent symptoms for more than 2-3 days.  You have a fever and your symptoms suddenly get worse. MAKE SURE YOU:   Understand these instructions.  Will watch your condition.  Will get help right away if you are not doing well or get worse.   This information is not intended to replace advice given to you by your health care provider. Make sure you discuss any questions you have with your health care provider.   Document Released: 06/11/2004 Document Revised: 05/25/2014 Document Reviewed: 01/10/2012 Elsevier Interactive Patient Education [employer] Inc[employer]

## 2015-05-24 LAB — IRON: Iron: 32 ug/dL — ABNORMAL LOW (ref 50–180)

## 2015-05-24 LAB — CULTURE, GROUP A STREP: ORGANISM ID, BACTERIA: NORMAL

## 2015-05-24 LAB — IBC PANEL
%SAT: 8 % — ABNORMAL LOW (ref 15–60)
TIBC: 408 ug/dL (ref 250–425)
UIBC: 376 ug/dL (ref 125–400)

## 2015-06-27 DIAGNOSIS — E611 Iron deficiency: Secondary | ICD-10-CM | POA: Insufficient documentation

## 2015-06-27 DIAGNOSIS — I280 Arteriovenous fistula of pulmonary vessels: Secondary | ICD-10-CM | POA: Insufficient documentation

## 2017-03-10 ENCOUNTER — Ambulatory Visit (INDEPENDENT_AMBULATORY_CARE_PROVIDER_SITE_OTHER): Payer: BC Managed Care – PPO | Admitting: Physician Assistant

## 2017-03-10 ENCOUNTER — Encounter: Payer: Self-pay | Admitting: Physician Assistant

## 2017-03-10 VITALS — BP 134/98 | HR 75 | Temp 98.5°F | Resp 18 | Ht 71.0 in | Wt 227.0 lb

## 2017-03-10 DIAGNOSIS — Z114 Encounter for screening for human immunodeficiency virus [HIV]: Secondary | ICD-10-CM | POA: Diagnosis not present

## 2017-03-10 DIAGNOSIS — J019 Acute sinusitis, unspecified: Secondary | ICD-10-CM | POA: Diagnosis not present

## 2017-03-10 DIAGNOSIS — E611 Iron deficiency: Secondary | ICD-10-CM

## 2017-03-10 DIAGNOSIS — Z125 Encounter for screening for malignant neoplasm of prostate: Secondary | ICD-10-CM

## 2017-03-10 MED ORDER — AZITHROMYCIN 250 MG PO TABS: ORAL_TABLET | ORAL | 0 refills | Status: DC

## 2017-03-10 MED ORDER — BENZONATATE 100 MG PO CAPS: 100.0000 mg | ORAL_CAPSULE | Freq: Three times a day (TID) | ORAL | 0 refills | Status: DC | PRN

## 2017-03-10 MED ORDER — AZELASTINE HCL 0.1 % NA SOLN: 2.0000 | Freq: Two times a day (BID) | NASAL | 0 refills | Status: DC

## 2017-03-10 NOTE — Patient Instructions (Addendum)
When you are well, please obtain a Td or Tdap vaccine.    IF you received an x-ray today, you will receive an invoice from [facility]. Please contact [facility] at [phone] with questions or concerns regarding your invoice.   IF you received labwork today, you will receive an invoice from [place]. Please contact [facility] at [phone] with questions or concerns regarding your invoice.   Our billing staff will not be able to assist you with questions regarding bills from these companies.  You will be contacted with the lab results as soon as they are available. The fastest way to get your results is to activate your My Chart account. Instructions are located on the last page of this paperwork. If you have not heard from [place] regarding the results in 2 weeks, please contact this office.     Sinusitis, Adult Sinusitis is soreness and inflammation of your sinuses. Sinuses are hollow spaces in the bones around your face. Your sinuses are located:  Around your eyes.  In the middle of your forehead.  Behind your nose.  In your cheekbones.  Your sinuses and nasal passages are lined with a stringy fluid (mucus). Mucus normally drains out of your sinuses. When your nasal tissues become inflamed or swollen, the mucus can become trapped or blocked so air cannot flow through your sinuses. This allows bacteria, viruses, and funguses to grow, which leads to infection. Sinusitis can develop quickly and last for 7?10 days (acute) or for more than 12 weeks (chronic). Sinusitis often develops after a cold. What are the causes? This condition is caused by anything that creates swelling in the sinuses or stops mucus from draining, including:  Allergies.  Asthma.  Bacterial or viral infection.  Abnormally shaped bones between the nasal passages.  Nasal growths that contain mucus (nasal polyps).  Narrow sinus openings.  Pollutants, such as chemicals or irritants in the  air.  A foreign object stuck in the nose.  A fungal infection. This is rare.  What increases the risk? The following factors may make you more likely to develop this condition:  Having allergies or asthma.  Having had a recent cold or respiratory tract infection.  Having structural deformities or blockages in your nose or sinuses.  Having a weak immune system.  Doing a lot of swimming or diving.  Overusing nasal sprays.  Smoking.  What are the signs or symptoms? The main symptoms of this condition are pain and a feeling of pressure around the affected sinuses. Other symptoms include:  Upper toothache.  Earache.  Headache.  Bad breath.  Decreased sense of smell and taste.  A cough that may get worse at night.  Fatigue.  Fever.  Thick drainage from your nose. The drainage is often green and it may contain pus (purulent).  Stuffy nose or congestion.  Postnasal drip. This is when extra mucus collects in the throat or back of the nose.  Swelling and warmth over the affected sinuses.  Sore throat.  Sensitivity to light.  How is this diagnosed? This condition is diagnosed based on symptoms, a medical history, and a physical exam. To find out if your condition is acute or chronic, your health care provider may:  Look in your nose for signs of nasal polyps.  Tap over the affected sinus to check for signs of infection.  View the inside of your sinuses using an imaging device that has a light attached (endoscope).  If your health care provider suspects that you have  chronic sinusitis, you may also:  Be tested for allergies.  Have a sample of mucus taken from your nose (nasal culture) and checked for bacteria.  Have a mucus sample examined to see if your sinusitis is related to an allergy.  If your sinusitis does not respond to treatment and it lasts longer than 8 weeks, you may have an MRI or CT scan to check your sinuses. These scans also help to determine  how severe your infection is. In rare cases, a bone biopsy may be done to rule out more serious types of fungal sinus disease. How is this treated? Treatment for sinusitis depends on the cause and whether your condition is chronic or acute. If a virus is causing your sinusitis, your symptoms will go away on their own within 10 days. You may be given medicines to relieve your symptoms, including:  Topical nasal decongestants. They shrink swollen nasal passages and let mucus drain from your sinuses.  Antihistamines. These drugs block inflammation that is triggered by allergies. This can help to ease swelling in your nose and sinuses.  Topical nasal corticosteroids. These are nasal sprays that ease inflammation and swelling in your nose and sinuses.  Nasal saline washes. These rinses can help to get rid of thick mucus in your nose.  If your condition is caused by bacteria, you will be given an antibiotic medicine. If your condition is caused by a fungus, you will be given an antifungal medicine. Surgery may be needed to correct underlying conditions, such as narrow nasal passages. Surgery may also be needed to remove polyps. Follow these instructions at home: Medicines  Take, use, or apply over-the-counter and prescription medicines only as told by your health care provider. These may include nasal sprays.  If you were prescribed an antibiotic medicine, take it as told by your health care provider. Do not stop taking the antibiotic even if you start to feel better. Hydrate and Humidify  Drink enough water to keep your urine clear or pale yellow. Staying hydrated will help to thin your mucus.  Use a cool mist humidifier to keep the humidity level in your home above 50%.  Inhale steam for 10-15 minutes, 3-4 times a day or as told by your health care provider. You can do this in the bathroom while a hot shower is running.  Limit your exposure to cool or dry air. Rest  Rest as much as  possible.  Sleep with your head raised (elevated).  Make sure to get enough sleep each night. General instructions  Apply a warm, moist washcloth to your face 3-4 times a day or as told by your health care provider. This will help with discomfort.  Wash your hands often with soap and water to reduce your exposure to viruses and other germs. If soap and water are not available, use hand sanitizer.  Do not smoke. Avoid being around people who are smoking (secondhand smoke).  Keep all follow-up visits as told by your health care provider. This is important. Contact a health care provider if:  You have a fever.  Your symptoms get worse.  Your symptoms do not improve within 10 days. Get help right away if:  You have a severe headache.  You have persistent vomiting.  You have pain or swelling around your face or eyes.  You have vision problems.  You develop confusion.  Your neck is stiff.  You have trouble breathing. This information is not intended to replace advice given to you by  your health care provider. Make sure you discuss any questions you have with your health care provider. Document Released: 05/04/2005 Document Revised: 12/29/2015 Document Reviewed: 02/27/2015 Elsevier Interactive Patient Education  2017 [employer]

## 2017-03-10 NOTE — Progress Notes (Signed)
Patient ID: Marcus Ball, male    DOB: 09-Nov-1970, 46 y.o.   MRN: [id number]  PCP: Patient, No Pcp Per  Chief Complaint  Patient presents with  . Illness    x1 week, pt states about a week ago he has congestion with cough and now he is experiencing sore throat and it hurts to swallow and fatigue. Pt states he has been taking OTC Tylenol Sinus.    Subjective:   Presents for evaluation of illness x 10 days.  10 days ago developed cough, chest congestion. 2 children with same at home. 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  For the past 3 has had sore throat, sinus congestion, ear and facial pain. Subjective fever, none measured. Some neck pain, no other body aches. Fatigued recently. "I think it's my iron level." He has long-standing iron deficiency. Last labs estimated about 1 year ago. No recent follow-up for specialty care regarding this nor Osler-Weber-Rendu syndrome.  Had a flu vaccine 1 week ago.   Review of Systems As above    Patient Active Problem List   Diagnosis Date Noted  . Iron deficiency 06/27/2015  . 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 Bell's palsy 02/24/2012  . 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 Authorizing Provider  allopurinol (ZYLOPRIM) 300 MG tablet Take 300 mg by mouth.    [provider]  IRON PO Take 45 mg by mouth.    [provider]     Allergies  Allergen Reactions  . Other Other (See Comments)    **Cannot take blood thinners(HHT) - causes anemia  . Penicillins Hives  . Sulfa Antibiotics Hives       Objective:  Physical Exam  Constitutional: He is oriented to person, place, and time. He appears well-developed and well-nourished. No distress.  BP (!) 134/98 (BP Location: Left Arm, Patient Position:  Sitting, Cuff Size: Normal)   Pulse 75   Temp 98.5 F (36.9 C) (Oral)   Resp 18   Ht 5\' 11"  (1.803 m)   Wt 227 lb (103 kg)   SpO2 97%   BMI 31.66 kg/m    HENT:  Head: Normocephalic and atraumatic.  Right Ear: Hearing, tympanic membrane, external ear and ear canal normal.  Left Ear: Hearing, tympanic membrane, external ear and ear canal normal.  Nose: Mucosal edema and rhinorrhea present.  No foreign bodies. Right sinus exhibits maxillary sinus tenderness and frontal sinus tenderness. Left sinus exhibits maxillary sinus tenderness and frontal sinus tenderness.  Mouth/Throat: Uvula is midline, oropharynx is clear and moist and mucous membranes are normal. No uvula swelling. No oropharyngeal exudate.  Eyes: Conjunctivae and EOM are normal. Pupils are equal, round, and reactive to light. Right eye exhibits no discharge. Left eye exhibits no discharge. No scleral icterus.  Neck: Trachea normal, normal range of motion and full passive range of motion without pain. Neck supple. No thyroid mass and no thyromegaly present.  Cardiovascular: Normal rate, regular rhythm and normal heart sounds.  Pulmonary/Chest: Effort normal and breath sounds normal.  Lymphadenopathy:       Head (right side): No submandibular, no tonsillar, no preauricular, no posterior auricular and no occipital adenopathy present.       Head (left side): No submandibular, no tonsillar, no preauricular and no occipital adenopathy present.  He has no cervical adenopathy.       Right: No supraclavicular adenopathy present.       Left: No supraclavicular adenopathy present.  Neurological: He is alert and oriented to person, place, and time. He has normal strength. No cranial nerve deficit or sensory deficit.  Skin: Skin is warm, dry and intact. No rash noted.  Psychiatric: He has a normal mood and affect. His speech is normal and behavior is normal.           Assessment & Plan:   1. Acute non-recurrent sinusitis,  unspecified location PCN allergic, so treat with azithromycin. Supportive care. Anticipatory guidance provided. - azelastine (ASTELIN) 0.1 % nasal spray; Place 2 sprays into both nostrils 2 (two) times daily. Use in each nostril as directed  Dispense: 30 mL; Refill: 0 - benzonatate (TESSALON) 100 MG capsule; Take 1-2 capsules (100-200 mg total) by mouth 3 (three) times daily as needed for cough.  Dispense: 40 capsule; Refill: 0 - azithromycin (ZITHROMAX) 250 MG tablet; Take 2 tabs PO x 1 dose, then 1 tab PO QD x 4 days  Dispense: 6 tablet; Refill: 0  2. Iron deficiency Not checked in at least 1 year. Update labs today. - CBC with Differential/Platelet - Iron, TIBC and Ferritin Panel  3. Screening for prostate cancer - PSA  4. Screening for HIV (human immunodeficiency virus) - HIV antibody    No Follow-up on file.   Fernande Brashelle S. Varvara Legault, PA-C [facility]

## 2017-03-11 LAB — CBC WITH DIFFERENTIAL/PLATELET
Basophils Absolute: 0 10*3/uL (ref 0.0–0.2)
Basos: 0 %
EOS (ABSOLUTE): 0.2 10*3/uL (ref 0.0–0.4)
EOS: 3 %
HEMATOCRIT: 42.8 % (ref 37.5–51.0)
Hemoglobin: 14.4 g/dL (ref 13.0–17.7)
Immature Grans (Abs): 0 10*3/uL (ref 0.0–0.1)
Immature Granulocytes: 0 %
LYMPHS ABS: 2 10*3/uL (ref 0.7–3.1)
Lymphs: 26 %
MCH: 28.3 pg (ref 26.6–33.0)
MCHC: 33.6 g/dL (ref 31.5–35.7)
MCV: 84 fL (ref 79–97)
MONOS ABS: 0.7 10*3/uL (ref 0.1–0.9)
Monocytes: 9 %
NEUTROS PCT: 62 %
Neutrophils Absolute: 4.6 10*3/uL (ref 1.4–7.0)
PLATELETS: 188 10*3/uL (ref 150–379)
RBC: 5.09 x10E6/uL (ref 4.14–5.80)
RDW: 14.4 % (ref 12.3–15.4)
WBC: 7.5 10*3/uL (ref 3.4–10.8)

## 2017-03-11 LAB — IRON,TIBC AND FERRITIN PANEL
Ferritin: 48 ng/mL (ref 30–400)
Iron Saturation: 10 % — ABNORMAL LOW (ref 15–55)
Iron: 41 ug/dL (ref 38–169)
Total Iron Binding Capacity: 401 ug/dL (ref 250–450)
UIBC: 360 ug/dL — AB (ref 111–343)

## 2017-03-11 LAB — HIV ANTIBODY (ROUTINE TESTING W REFLEX): HIV SCREEN 4TH GENERATION: NONREACTIVE

## 2017-03-11 LAB — PSA: PROSTATE SPECIFIC AG, SERUM: 0.4 ng/mL (ref 0.0–4.0)

## 2017-08-30 ENCOUNTER — Other Ambulatory Visit (INDEPENDENT_AMBULATORY_CARE_PROVIDER_SITE_OTHER): Payer: Self-pay | Admitting: Orthopaedic Surgery

## 2017-08-31 NOTE — Telephone Encounter (Signed)
Ok for refill? 

## 2017-09-26 ENCOUNTER — Other Ambulatory Visit (INDEPENDENT_AMBULATORY_CARE_PROVIDER_SITE_OTHER): Payer: Self-pay | Admitting: Orthopaedic Surgery

## 2017-09-27 NOTE — Telephone Encounter (Signed)
Rx request 

## 2018-02-18 ENCOUNTER — Ambulatory Visit: Payer: BC Managed Care – PPO | Admitting: Family Medicine

## 2018-02-18 ENCOUNTER — Other Ambulatory Visit: Payer: Self-pay

## 2018-02-18 ENCOUNTER — Encounter: Payer: Self-pay | Admitting: Family Medicine

## 2018-02-18 ENCOUNTER — Ambulatory Visit (INDEPENDENT_AMBULATORY_CARE_PROVIDER_SITE_OTHER): Payer: BC Managed Care – PPO

## 2018-02-18 VITALS — BP 148/92 | HR 65 | Temp 97.7°F | Resp 17 | Ht 71.0 in | Wt 227.2 lb

## 2018-02-18 DIAGNOSIS — Z23 Encounter for immunization: Secondary | ICD-10-CM

## 2018-02-18 DIAGNOSIS — R059 Cough, unspecified: Secondary | ICD-10-CM

## 2018-02-18 DIAGNOSIS — I780 Hereditary hemorrhagic telangiectasia: Secondary | ICD-10-CM

## 2018-02-18 DIAGNOSIS — R05 Cough: Secondary | ICD-10-CM

## 2018-02-18 LAB — POCT CBC
Granulocyte percent: 5.6 %G — AB (ref 37–80)
HCT, POC: 42.2 % — AB (ref 43.5–53.7)
HEMOGLOBIN: 14.1 g/dL (ref 14.1–18.1)
Lymph, poc: 26.6 — AB (ref 0.6–3.4)
MCH, POC: 27.3 pg (ref 27–31.2)
MCHC: 33.5 g/dL (ref 31.8–35.4)
MCV: 81.6 fL (ref 80–97)
MID (CBC): 71.3 — AB (ref 0–0.9)
MPV: 9 fL (ref 0–99.8)
PLATELET COUNT, POC: 215 10*3/uL (ref 142–424)
POC Granulocyte: 0.2 — AB (ref 2–6.9)
POC LYMPH PERCENT: 2.1 %L — AB (ref 10–50)
POC MID %: 2.1 %M (ref 0–12)
RBC: 5.17 M/uL (ref 4.69–6.13)
RDW, POC: 14.9 %
WBC: 7.8 10*3/uL (ref 4.6–10.2)

## 2018-02-18 MED ORDER — DOXYCYCLINE HYCLATE 100 MG PO TABS: 100.0000 mg | ORAL_TABLET | Freq: Two times a day (BID) | ORAL | 0 refills | Status: DC

## 2018-02-18 MED ORDER — BENZONATATE 100 MG PO CAPS: 100.0000 mg | ORAL_CAPSULE | Freq: Three times a day (TID) | ORAL | 0 refills | Status: DC | PRN

## 2018-02-18 NOTE — Progress Notes (Signed)
Patient ID: Marcus Ball, male    DOB: [birth year]  Age: 47 y.o. MRN: [id number]  Chief Complaint  Patient presents with  . sinus symptoms    x 10-12 days, cough and chest congestion with green mucus drainage, taking otc decongestants for sxs, left ear pain, no fevers.  Per pt wants iron and blood count checked.  Pt would also like flu vaccine.    Subjective:   Patient has been having a bad cough for the past week.  He has not been febrile.  Bringing up some snot looking mucus.  Has continued to work.  Little runny nose and little left ear pain.  Current allergies, medications, problem list, past/family and social histories reviewed.  Objective:  BP (!) 148/92 (BP Location: Right Arm, Patient Position: Sitting, Cuff Size: Large)   Pulse 65   Temp 97.7 F (36.5 C) (Oral)   Resp 17   Ht 5\' 11"  (1.803 m)   Wt 227 lb 3.2 oz (103.1 kg)   SpO2 98%   BMI 31.69 kg/m   No acute distress.  TMs normal.  Throat clear.  Neck supple without nodes.  Chest clear except for at the left lower lung as clear-cut rhonchi and rales.  Chest x-ray normal   Assessment & Plan:   Assessment: 1. Cough   2. Hereditary hemorrhagic telangiectasia (HCC)       Plan: I was surprised this did not show a pneumonia.  See instructions.  Patient has hereditary hemorrhagic telangiectasia and needed his regular labs checked for that also.  Orders Placed This Encounter  Procedures  . DG Chest 2 View    Standing Status:   Future    Number of Occurrences:   1    Standing Expiration Date:   02/18/2019    Order Specific Question:   Reason for Exam (SYMPTOM  OR DIAGNOSIS REQUIRED)    Answer:   rll rhonchi and rales, cough    Order Specific Question:   Preferred imaging location?    Answer:   External  . Iron, TIBC and Ferritin Panel  . POCT CBC    Meds ordered this encounter  Medications  . doxycycline (VIBRA-TABS) 100 MG tablet    Sig: Take 1 tablet (100 mg total) by mouth 2 (two) times daily.    Dispense:  20 tablet    Refill:  0  . benzonatate (TESSALON) 100 MG capsule    Sig: Take 1-2 capsules (100-200 mg total) by mouth 3 (three) times daily as needed for cough.    Dispense:  40 capsule    Refill:  0         Patient Instructions   Drink plenty of fluids and get enough rest  Although your chest sounds like a pneumonia, chest x-ray is clear.  Take Mucinex DM if needed for cough  For daytime cough you can take benzonatate cough pills 1 or 2 pills 3 times daily  Doxycycline 100 mg 1 twice daily for antibiotic.  Take with a little food but not with dairy products.  Avoid excessive sun exposure as you may burn easily.  Return at any time if acutely worse or go to the emergency room if necessary.  Return in 1 week for a recheck.        If you have lab work done today you will be contacted with your lab results within the next 2 weeks.  If you have not heard from [place] then please contact us. The fastest way to  get your results is to register for My Chart.   IF you received an x-ray today, you will receive an invoice from [facility]. Please contact [facility] at [phone] with questions or concerns regarding your invoice.   IF you received labwork today, you will receive an invoice from [facility]. Please contact [facility] at [phone] with questions or concerns regarding your invoice.   Our billing staff will not be able to assist you with questions regarding bills from these companies.  You will be contacted with the lab results as soon as they are available. The fastest way to get your results is to activate your My Chart account. Instructions are located on the last page of this paperwork. If you have not heard from [place] regarding the results in 2 weeks, please contact this office.        No follow-ups on file.   Janace Hoard, MD 02/18/2018

## 2018-02-18 NOTE — Patient Instructions (Addendum)
Drink plenty of fluids and get enough rest  Although your chest sounds like a pneumonia, chest x-ray is clear.  Take Mucinex DM if needed for cough  For daytime cough you can take benzonatate cough pills 1 or 2 pills 3 times daily  Doxycycline 100 mg 1 twice daily for antibiotic.  Take with a little food but not with dairy products.  Avoid excessive sun exposure as you may burn easily.  Return at any time if acutely worse or go to the emergency room if necessary.  Return in 1 week for a recheck.        If you have lab work done today you will be contacted with your lab results within the next 2 weeks.  If you have not heard from [place] then please contact us. The fastest way to get your results is to register for My Chart.   IF you received an x-ray today, you will receive an invoice from [facility]. Please contact [facility] at [phone] with questions or concerns regarding your invoice.   IF you received labwork today, you will receive an invoice from [facility]. Please contact [facility] at [phone] with questions or concerns regarding your invoice.   Our billing staff will not be able to assist you with questions regarding bills from these companies.  You will be contacted with the lab results as soon as they are available. The fastest way to get your results is to activate your My Chart account. Instructions are located on the last page of this paperwork. If you have not heard from [place] regarding the results in 2 weeks, please contact this office.

## 2018-02-19 LAB — IRON,TIBC AND FERRITIN PANEL
Ferritin: 33 ng/mL (ref 30–400)
IRON: 47 ug/dL (ref 38–169)
Iron Saturation: 11 % — ABNORMAL LOW (ref 15–55)
TIBC: 438 ug/dL (ref 250–450)
UIBC: 391 ug/dL — AB (ref 111–343)

## 2018-02-23 ENCOUNTER — Telehealth: Payer: Self-pay | Admitting: Family Medicine

## 2018-02-23 NOTE — Telephone Encounter (Unsigned)
Copied from CRM [id number]. Topic: Inquiry >> Feb 23, 2018  1:34 PM Crist Infante wrote: Reason for CRM: pt states he was seen 10/04 by Dr Alwyn Ren and is not any better.  Pt had an appt with pulmonary (initial visit for sleep study) who listened to his lungs, heard crackling and advised he call his dr back, and get further treatment. Pt did not know if something else can be called in, or does he need to be seen? Pt is scheduled tomorrow if that is ok?

## 2018-02-24 ENCOUNTER — Ambulatory Visit: Payer: BC Managed Care – PPO | Admitting: Physician Assistant

## 2018-02-24 ENCOUNTER — Encounter: Payer: Self-pay | Admitting: Family Medicine

## 2018-02-24 NOTE — Telephone Encounter (Signed)
Pt had appt for 10/9 and cancelled it.

## 2018-10-25 IMAGING — DX DG CHEST 2V
2 series · 2 of 2 positions shown · non-contrast
Comparison: June 11, 2004

CLINICAL DATA: Cough with rhonchi

EXAM:
CHEST - 2 VIEW

[chest pa]
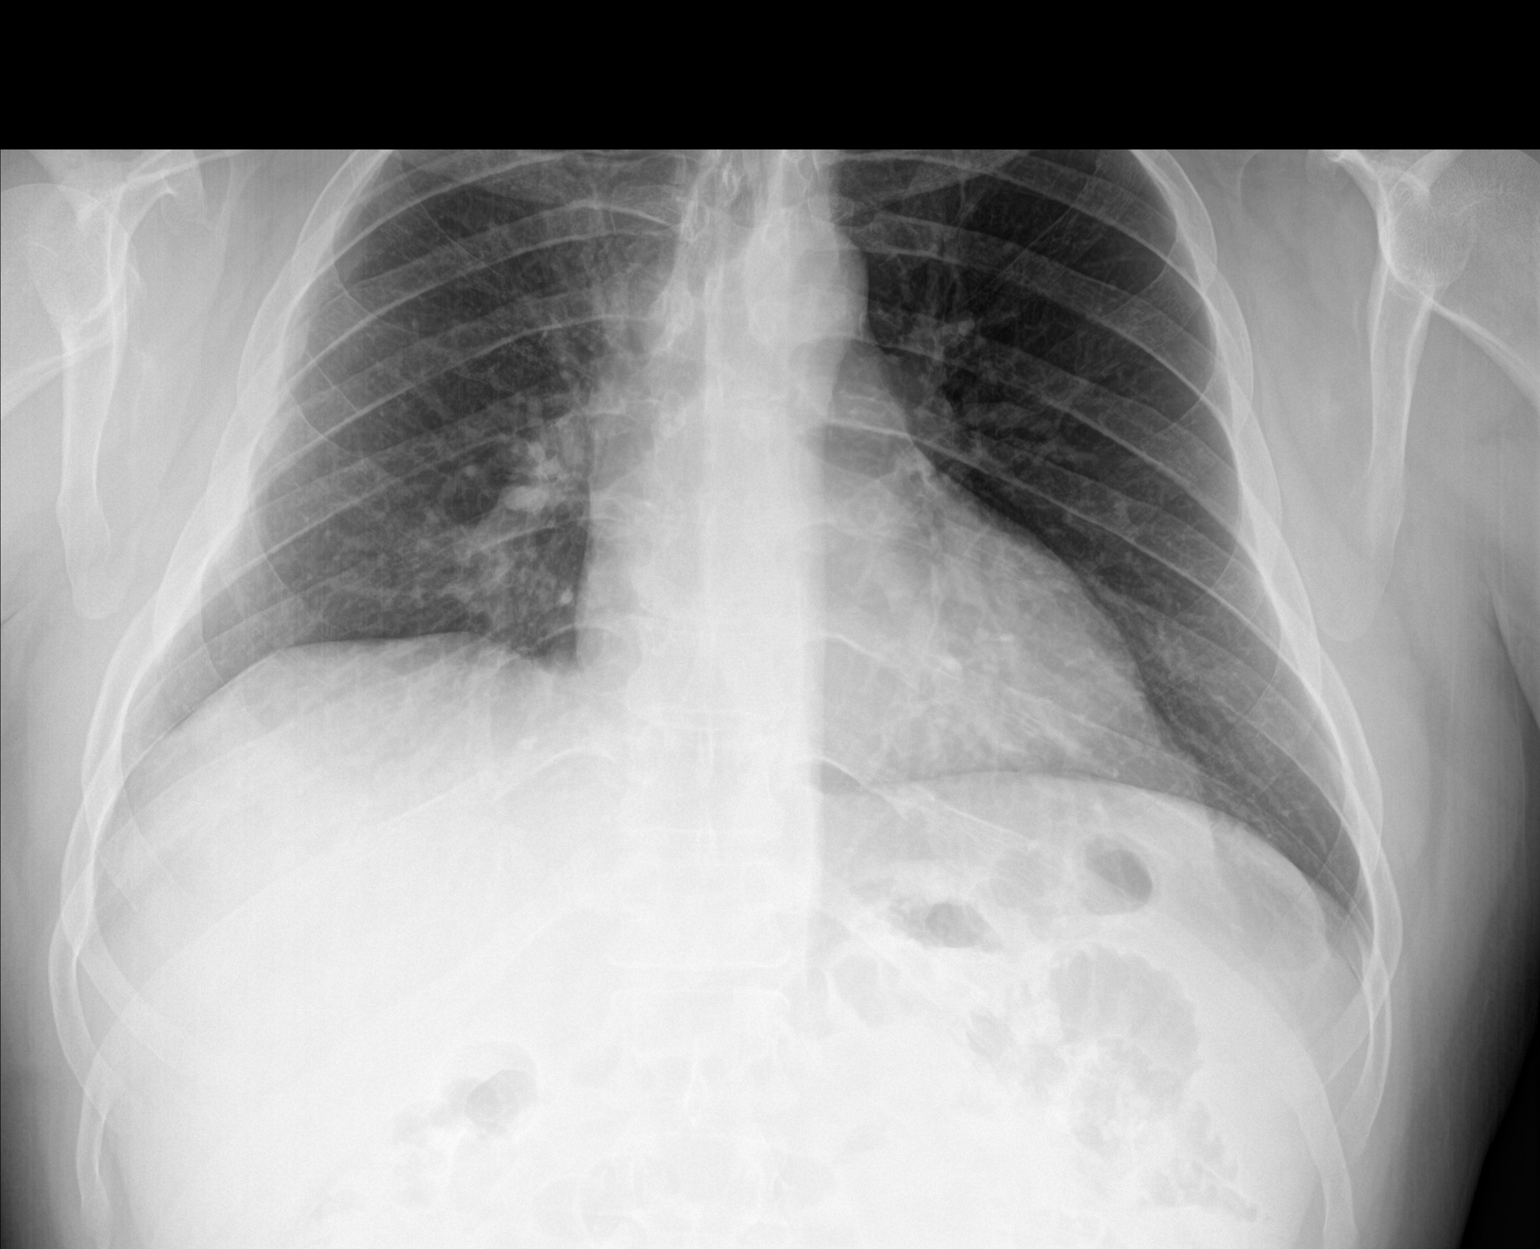

[chest lat]
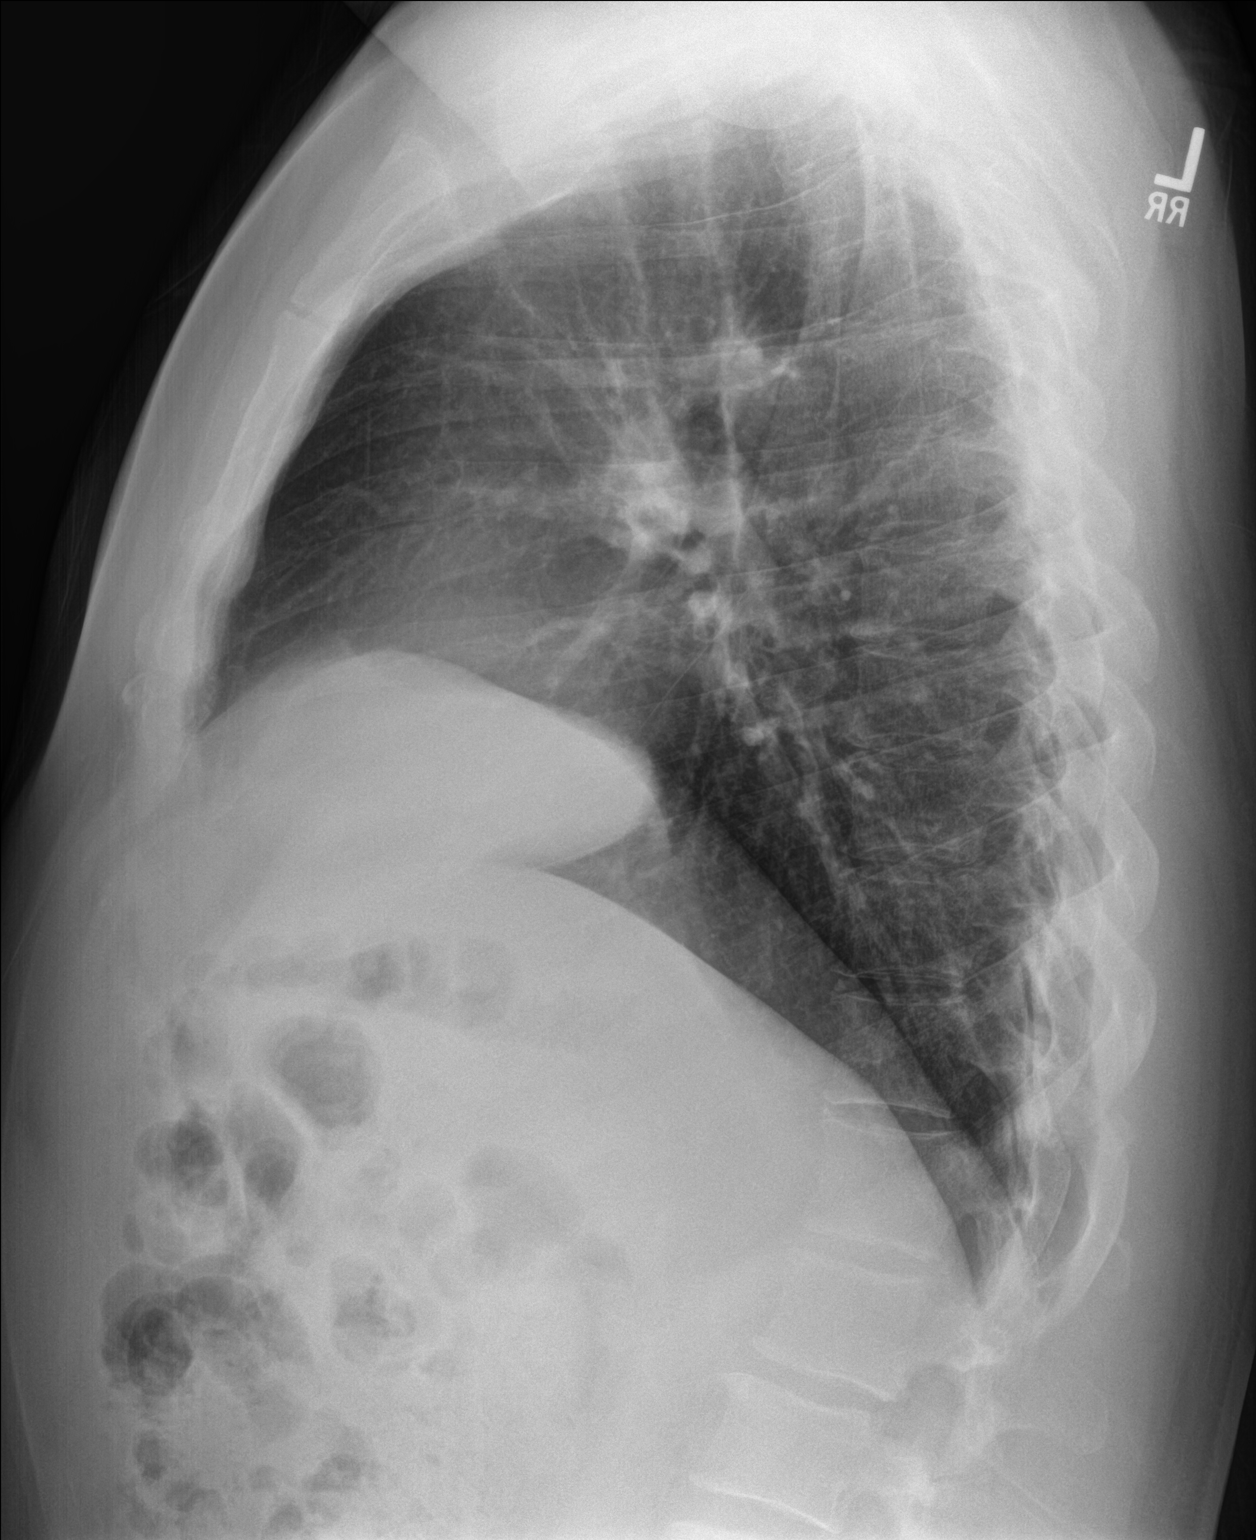

[2 of 2 positions shown; findings below may reference images not displayed]

FINDINGS: No edema or consolidation. The heart size and pulmonary vascularity
are normal. No adenopathy. No bone lesions.
IMPRESSION: No edema or consolidation.

## 2019-01-31 ENCOUNTER — Other Ambulatory Visit (INDEPENDENT_AMBULATORY_CARE_PROVIDER_SITE_OTHER): Payer: Self-pay | Admitting: Orthopaedic Surgery

## 2019-02-01 NOTE — Telephone Encounter (Signed)
Please advise 

## 2019-04-04 ENCOUNTER — Telehealth: Payer: Self-pay | Admitting: Orthopaedic Surgery

## 2019-04-04 MED ORDER — NAPROXEN 500 MG PO TABS: 500.0000 mg | ORAL_TABLET | Freq: Two times a day (BID) | ORAL | 0 refills | Status: DC

## 2019-04-04 NOTE — Telephone Encounter (Signed)
Ok thanks 

## 2019-04-04 NOTE — Telephone Encounter (Signed)
Please advise 

## 2019-04-04 NOTE — Telephone Encounter (Signed)
Patient called for a refill. Naproxen   Please cal patient to advise [phone]

## 2019-04-04 NOTE — Telephone Encounter (Signed)
Rx sent to pharmacy. I called patient and advised. ?

## 2020-11-25 ENCOUNTER — Other Ambulatory Visit (INDEPENDENT_AMBULATORY_CARE_PROVIDER_SITE_OTHER): Payer: Self-pay | Admitting: Orthopaedic Surgery

## 2020-11-25 MED ORDER — NAPROXEN 500 MG PO TABS: 500.0000 mg | ORAL_TABLET | Freq: Two times a day (BID) | ORAL | 0 refills | Status: DC

## 2020-11-25 MED ORDER — ALLOPURINOL 300 MG PO TABS: 300.0000 mg | ORAL_TABLET | Freq: Every day | ORAL | 0 refills | Status: DC

## 2020-11-25 NOTE — Telephone Encounter (Signed)
Please advise 

## 2021-11-25 ENCOUNTER — Observation Stay (HOSPITAL_COMMUNITY): Payer: BC Managed Care – PPO

## 2021-11-25 ENCOUNTER — Encounter (HOSPITAL_COMMUNITY): Payer: Self-pay | Admitting: Emergency Medicine

## 2021-11-25 ENCOUNTER — Observation Stay (HOSPITAL_COMMUNITY)
Admission: EM | Admit: 2021-11-25 | Discharge: 2021-11-27 | Disposition: A | Discharge status: Home / Self Care | Payer: BC Managed Care – PPO | Attending: Internal Medicine | Admitting: Internal Medicine

## 2021-11-25 ENCOUNTER — Other Ambulatory Visit: Payer: Self-pay

## 2021-11-25 DIAGNOSIS — K5731 Diverticulosis of large intestine without perforation or abscess with bleeding: Secondary | ICD-10-CM | POA: Diagnosis not present

## 2021-11-25 DIAGNOSIS — R7989 Other specified abnormal findings of blood chemistry: Secondary | ICD-10-CM | POA: Insufficient documentation

## 2021-11-25 DIAGNOSIS — K449 Diaphragmatic hernia without obstruction or gangrene: Secondary | ICD-10-CM | POA: Diagnosis not present

## 2021-11-25 DIAGNOSIS — I780 Hereditary hemorrhagic telangiectasia: Secondary | ICD-10-CM

## 2021-11-25 DIAGNOSIS — Z79899 Other long term (current) drug therapy: Secondary | ICD-10-CM | POA: Insufficient documentation

## 2021-11-25 DIAGNOSIS — J069 Acute upper respiratory infection, unspecified: Secondary | ICD-10-CM | POA: Insufficient documentation

## 2021-11-25 DIAGNOSIS — K31819 Angiodysplasia of stomach and duodenum without bleeding: Secondary | ICD-10-CM

## 2021-11-25 DIAGNOSIS — K641 Second degree hemorrhoids: Secondary | ICD-10-CM

## 2021-11-25 DIAGNOSIS — D509 Iron deficiency anemia, unspecified: Secondary | ICD-10-CM | POA: Diagnosis not present

## 2021-11-25 DIAGNOSIS — K31811 Angiodysplasia of stomach and duodenum with bleeding: Secondary | ICD-10-CM | POA: Diagnosis not present

## 2021-11-25 DIAGNOSIS — R195 Other fecal abnormalities: Secondary | ICD-10-CM | POA: Diagnosis present

## 2021-11-25 DIAGNOSIS — K922 Gastrointestinal hemorrhage, unspecified: Secondary | ICD-10-CM

## 2021-11-25 DIAGNOSIS — D649 Anemia, unspecified: Secondary | ICD-10-CM | POA: Diagnosis present

## 2021-11-25 DIAGNOSIS — K5730 Diverticulosis of large intestine without perforation or abscess without bleeding: Secondary | ICD-10-CM

## 2021-11-25 LAB — CBC WITH DIFFERENTIAL/PLATELET
Abs Immature Granulocytes: 0 10*3/uL (ref 0.00–0.07)
Basophils Absolute: 0.1 10*3/uL (ref 0.0–0.1)
Basophils Relative: 1 %
Eosinophils Absolute: 0.1 10*3/uL (ref 0.0–0.5)
Eosinophils Relative: 1 %
HCT: 26.5 % — ABNORMAL LOW (ref 39.0–52.0)
Hemoglobin: 7 g/dL — ABNORMAL LOW (ref 13.0–17.0)
Lymphocytes Relative: 0 %
Lymphs Abs: 0 10*3/uL — ABNORMAL LOW (ref 0.7–4.0)
MCH: 16.1 pg — ABNORMAL LOW (ref 26.0–34.0)
MCHC: 26.4 g/dL — ABNORMAL LOW (ref 30.0–36.0)
MCV: 60.9 fL — ABNORMAL LOW (ref 80.0–100.0)
Monocytes Absolute: 0 10*3/uL — ABNORMAL LOW (ref 0.1–1.0)
Monocytes Relative: 0 %
Neutro Abs: 7.8 10*3/uL — ABNORMAL HIGH (ref 1.7–7.7)
Neutrophils Relative %: 98 %
Platelets: 274 10*3/uL (ref 150–400)
RBC: 4.35 MIL/uL (ref 4.22–5.81)
RDW: 21.2 % — ABNORMAL HIGH (ref 11.5–15.5)
WBC: 8 10*3/uL (ref 4.0–10.5)
nRBC: 0 /100 WBC
nRBC: 0.3 % — ABNORMAL HIGH (ref 0.0–0.2)

## 2021-11-25 LAB — RETICULOCYTES
Immature Retic Fract: 29.1 % — ABNORMAL HIGH (ref 2.3–15.9)
RBC.: 4.35 MIL/uL (ref 4.22–5.81)
Retic Count, Absolute: 75.7 10*3/uL (ref 19.0–186.0)
Retic Ct Pct: 1.7 % (ref 0.4–3.1)

## 2021-11-25 LAB — COMPREHENSIVE METABOLIC PANEL
ALT: 21 U/L (ref 0–44)
AST: 25 U/L (ref 15–41)
Albumin: 4.2 g/dL (ref 3.5–5.0)
Alkaline Phosphatase: 82 U/L (ref 38–126)
Anion gap: 10 (ref 5–15)
BUN: 12 mg/dL (ref 6–20)
CO2: 23 mmol/L (ref 22–32)
Calcium: 9.5 mg/dL (ref 8.9–10.3)
Chloride: 106 mmol/L (ref 98–111)
Creatinine, Ser: 1.31 mg/dL — ABNORMAL HIGH (ref 0.61–1.24)
GFR, Estimated: >60 mL/min (ref >60)
Glucose, Bld: 153 mg/dL — ABNORMAL HIGH (ref 70–99)
Potassium: 4.5 mmol/L (ref 3.5–5.1)
Sodium: 139 mmol/L (ref 135–145)
Total Bilirubin: 0.8 mg/dL (ref 0.3–1.2)
Total Protein: 7.6 g/dL (ref 6.5–8.1)

## 2021-11-25 LAB — IRON AND TIBC
Iron: 16 ug/dL — ABNORMAL LOW (ref 45–182)
Saturation Ratios: 3 % — ABNORMAL LOW (ref 17.9–39.5)
TIBC: 598 ug/dL — ABNORMAL HIGH (ref 250–450)
UIBC: 582 ug/dL

## 2021-11-25 LAB — PREPARE RBC (CROSSMATCH)

## 2021-11-25 LAB — CBC
HCT: 26.6 % — ABNORMAL LOW (ref 39.0–52.0)
Hemoglobin: 7.3 g/dL — ABNORMAL LOW (ref 13.0–17.0)
MCH: 17.5 pg — ABNORMAL LOW (ref 26.0–34.0)
MCHC: 27.4 g/dL — ABNORMAL LOW (ref 30.0–36.0)
MCV: 63.8 fL — ABNORMAL LOW (ref 80.0–100.0)
Platelets: 234 10*3/uL (ref 150–400)
RBC: 4.17 MIL/uL — ABNORMAL LOW (ref 4.22–5.81)
RDW: 23.5 % — ABNORMAL HIGH (ref 11.5–15.5)
WBC: 9.5 10*3/uL (ref 4.0–10.5)
nRBC: 0 % (ref 0.0–0.2)

## 2021-11-25 LAB — FERRITIN: Ferritin: 5 ng/mL — ABNORMAL LOW (ref 24–336)

## 2021-11-25 LAB — VITAMIN B12: Vitamin B-12: 399 pg/mL (ref 180–914)

## 2021-11-25 LAB — POC OCCULT BLOOD, ED: Fecal Occult Bld: POSITIVE — AB

## 2021-11-25 LAB — FOLATE: Folate: 22 ng/mL (ref >5.9)

## 2021-11-25 LAB — ABO/RH: ABO/RH(D): A POS

## 2021-11-25 MED ORDER — PEG-KCL-NACL-NASULF-NA ASC-C 100 G PO SOLR
0.5000 | Freq: Once | ORAL | Status: AC
  Administered 2021-11-25: 100 g via ORAL

## 2021-11-25 MED ORDER — METOCLOPRAMIDE HCL 5 MG/ML IJ SOLN
10.0000 mg | Freq: Four times a day (QID) | INTRAMUSCULAR | Status: DC
  Administered 2021-11-25: 10 mg via INTRAVENOUS
  Filled 2021-11-25: qty 2

## 2021-11-25 MED ORDER — SODIUM CHLORIDE 0.45 % IV SOLN: INTRAVENOUS | Status: AC

## 2021-11-25 MED ORDER — PEG-KCL-NACL-NASULF-NA ASC-C 100 G PO SOLR
0.5000 | Freq: Once | ORAL | Status: AC
  Administered 2021-11-25: 100 g via ORAL
  Filled 2021-11-25: qty 1

## 2021-11-25 MED ORDER — ONDANSETRON HCL 4 MG/2ML IJ SOLN: 4.0000 mg | Freq: Four times a day (QID) | INTRAMUSCULAR | Status: DC | PRN

## 2021-11-25 MED ORDER — SODIUM CHLORIDE 0.9 % IV SOLN: 10.0000 mL/h | Freq: Once | INTRAVENOUS | Status: DC

## 2021-11-25 MED ORDER — BISACODYL 5 MG PO TBEC
20.0000 mg | DELAYED_RELEASE_TABLET | Freq: Once | ORAL | Status: AC
  Administered 2021-11-25: 20 mg via ORAL
  Filled 2021-11-25: qty 4

## 2021-11-25 MED ORDER — ONDANSETRON HCL 4 MG PO TABS: 4.0000 mg | ORAL_TABLET | Freq: Four times a day (QID) | ORAL | Status: DC | PRN

## 2021-11-25 MED ORDER — PANTOPRAZOLE SODIUM 40 MG IV SOLR
40.0000 mg | Freq: Two times a day (BID) | INTRAVENOUS | Status: DC
  Administered 2021-11-25: 40 mg via INTRAVENOUS
  Filled 2021-11-25: qty 10

## 2021-11-25 MED ORDER — ACETAMINOPHEN 650 MG RE SUPP: 650.0000 mg | Freq: Four times a day (QID) | RECTAL | Status: DC | PRN

## 2021-11-25 MED ORDER — ACETAMINOPHEN 325 MG PO TABS: 650.0000 mg | ORAL_TABLET | Freq: Four times a day (QID) | ORAL | Status: DC | PRN

## 2021-11-25 MED ORDER — PEG-KCL-NACL-NASULF-NA ASC-C 100 G PO SOLR: 1.0000 | Freq: Once | ORAL | Status: DC

## 2021-11-25 MED ORDER — SODIUM CHLORIDE 0.9 % IV SOLN
250.0000 mg | Freq: Every day | INTRAVENOUS | Status: DC
  Filled 2021-11-25 (×3): qty 20

## 2021-11-25 NOTE — ED Provider Notes (Signed)
[facility] EMERGENCY DEPARTMENT Provider Note   CSN: [id number] Arrival date & time: 11/25/21  9563     History  Chief Complaint  Patient presents with   Anemia    Marcus Ball is a 51 y.o. male with history of hereditary hemorrhagic telangiectasias (HHT), follows at [facility], who presents the emergency department complaining of abnormal labs.  Patient originally went to urgent care for upper respiratory symptoms, and they drew blood work based on the patient appearing pale.  They told him that his hemoglobin level was 6.8.  He denies ever having problems with low hemoglobin levels in the past.  Has been having some nosebleeds, which he reports is normal for him.  No blood in stool or any vomiting.  He notes that he has been craving ice for several months.  Patient recently got back from vacation, and has been sleeping more than normal.   Anemia Pertinent negatives include no shortness of breath.       Home Medications Prior to Admission medications   Medication Sig Start Date End Date Taking? Authorizing Provider  allopurinol (ZYLOPRIM) 300 MG tablet Take 1 tablet (300 mg total) by mouth daily. 11/25/20   Eldred Manges, MD  azelastine (ASTELIN) 0.1 % nasal spray Place 2 sprays into both nostrils 2 (two) times daily. Use in each nostril as directed 03/10/17   Porfirio Oar, PA  azithromycin (ZITHROMAX) 250 MG tablet Take 2 tabs PO x 1 dose, then 1 tab PO QD x 4 days Patient not taking: Reported on 02/18/2018 03/10/17   Porfirio Oar, PA  benzonatate (TESSALON) 100 MG capsule Take 1-2 capsules (100-200 mg total) by mouth 3 (three) times daily as needed for cough. 02/18/18   Peyton Najjar, MD  doxycycline (VIBRA-TABS) 100 MG tablet Take 1 tablet (100 mg total) by mouth 2 (two) times daily. 02/18/18   Peyton Najjar, MD  IRON PO Take 45 mg by mouth.    [provider]  naproxen (NAPROSYN) 500 MG tablet Take 1 tablet (500 mg total) by mouth 2 (two)  times daily with a meal. 11/25/20   Eldred Manges, MD      Allergies    Other, Penicillins, and Sulfa antibiotics    Review of Systems   Review of Systems  Constitutional:  Positive for fatigue.  HENT:  Positive for congestion and nosebleeds.   Respiratory:  Negative for shortness of breath.   Skin:  Positive for pallor.  Neurological:  Negative for syncope.  All other systems reviewed and are negative.   Physical Exam Updated Vital Signs BP 133/82   Pulse 79   Temp 98 F (36.7 C) (Oral)   Resp 19   Ht 5\' 11"  (1.803 m)   Wt 103.1 kg   SpO2 100%   BMI 31.70 kg/m  Physical Exam Vitals and nursing note reviewed.  Constitutional:      Appearance: Normal appearance.  HENT:     Head: Normocephalic and atraumatic.     Mouth/Throat:     Mouth: Mucous membranes are moist. Mucous membranes are pale.  Eyes:     Conjunctiva/sclera: Conjunctivae normal.  Cardiovascular:     Rate and Rhythm: Normal rate and regular rhythm.  Pulmonary:     Effort: Pulmonary effort is normal. No respiratory distress.     Breath sounds: Normal breath sounds.  Abdominal:     General: There is no distension.     Palpations: Abdomen is soft.  Tenderness: There is no abdominal tenderness.  Skin:    General: Skin is warm and dry.     Coloration: Skin is pale.  Neurological:     General: No focal deficit present.     Mental Status: He is alert.     ED Results / Procedures / Treatments   Labs (all labs ordered are listed, but only abnormal results are displayed) Labs Reviewed  CBC WITH DIFFERENTIAL/PLATELET - Abnormal; Notable for the following components:      Result Value   Hemoglobin 7.0 (*)    HCT 26.5 (*)    MCV 60.9 (*)    MCH 16.1 (*)    MCHC 26.4 (*)    RDW 21.2 (*)    nRBC 0.3 (*)    Neutro Abs 7.8 (*)    Lymphs Abs 0.0 (*)    Monocytes Absolute 0.0 (*)    All other components within normal limits  COMPREHENSIVE METABOLIC PANEL - Abnormal; Notable for the following  components:   Glucose, Bld 153 (*)    Creatinine, Ser 1.31 (*)    All other components within normal limits  IRON AND TIBC - Abnormal; Notable for the following components:   Iron 16 (*)    TIBC 598 (*)    Saturation Ratios 3 (*)    All other components within normal limits  FERRITIN - Abnormal; Notable for the following components:   Ferritin 5 (*)    All other components within normal limits  RETICULOCYTES - Abnormal; Notable for the following components:   Immature Retic Fract 29.1 (*)    All other components within normal limits  POC OCCULT BLOOD, ED - Abnormal; Notable for the following components:   Fecal Occult Bld POSITIVE (*)    All other components within normal limits  VITAMIN B12  FOLATE  TYPE AND SCREEN  PREPARE RBC (CROSSMATCH)    EKG None  Radiology No results found.  Procedures Procedures    Medications Ordered in ED Medications  0.9 %  sodium chloride infusion (has no administration in time range)    ED Course/ Medical Decision Making/ A&P                           Medical Decision Making Amount and/or Complexity of Data Reviewed Labs: ordered.  This patient is a 51 y.o. male  who presents to the ED for concern of low hemoglobin levels.   Past Medical History / Co-morbidities: hereditary hemorrhagic telangiectasias (HHT), follows at [facility]  Additional history: Chart reviewed. Pertinent results include: Last hemoglobin level found in chart + Care Everywhere is 14.1 in 2019  Physical Exam: Physical exam performed. The pertinent findings include: Skin and mucous membranes pale. Guaiac positive.   Lab Tests/Imaging studies: I personally interpreted labs/imaging and the pertinent results include: Hemoglobin of 7, iron of 16, ferritin of 5.. Creatinine 1.31, none prior to compare to. Positive hemoccult.    Medications: I ordered medication including packed RBCs.  I have reviewed the patients home medicines and have made adjustments as  needed.  Consultations obtained: I consulted with gastroenterologist Dr Bryan Lemma who will consult on patient while admitted.  I consulted with hospitalist Dr Maryland Pink who will admit.    Disposition: After consideration of the diagnostic results and the patients response to treatment, I feel that patient is requiring admission for anemia secondary to GI bleed.   I discussed this case with my attending physician Dr. Gilford Raid who cosigned this  note including patient's presenting symptoms, physical exam, and planned diagnostics and interventions. Attending physician stated agreement with plan or made changes to plan which were implemented.    Final Clinical Impression(s) / ED Diagnoses Final diagnoses:  Iron deficiency anemia, unspecified iron deficiency anemia type  Gastrointestinal hemorrhage, unspecified gastrointestinal hemorrhage type    Rx / DC Orders ED Discharge Orders     None      Portions of this report may have been transcribed using voice recognition software. Every effort was made to ensure accuracy; however, inadvertent computerized transcription errors may be present.    Jeanella Flattery 11/25/21 1614    Jacalyn Lefevre, MD 11/25/21 [phone]

## 2021-11-25 NOTE — ED Triage Notes (Signed)
Patient sent to ED from urgent care after hgb result of 6.8, patient had gone to urgent care for evaluation of weakness for the last five days.

## 2021-11-25 NOTE — H&P (Signed)
[facility] History and Physical  ERASTO SLEIGHT UKG:[id number] DOB: 1970-05-28 DOA: 11/25/2021   PCP: Patient, No Pcp Per  Specialists: Followed by specialist and [facility]  Chief Complaint: Low hemoglobin  HPI: Marcus Ball is a 51 y.o. male with a past medical history of hereditary hemorrhagic telangiectasis (HHT), who recently returned after vacationing at the beach a few days ago.  Was experiencing cold-like symptoms with runny nose and a little cough.  Had low-grade fever at home.  He did test himself for COVID at home twice and both times it was negative.  Denies any recent sick contact.  No shortness of breath or chest pain.  Went to urgent care center to have this evaluated and they found him to be pale in appearance.  Hemoglobin was checked which showed up as 6.8.  He was sent to the emergency department.  Denies any blood loss recently.  Has not seen any black-colored stools.  No blood in the urine.  He occasionally gets nosebleeding but none recently. He does admit to a weight loss of about 10 pounds but over several months.  He has not been trying to lose weight.  Has never had upper endoscopy or colonoscopy previously.  In the emergency department his blood work was repeated and his hemoglobin was noted to be 7.  Anemia panel showed severe iron deficiency.  Rectal exam was done and his stool was noted to be heme positive.  1 unit of PRBC was ordered.  Gastroenterology was consulted.  He will be hospitalized for further management.   Home Medications: This medication list has not been reconciled yet. Prior to Admission medications   Medication Sig Start Date End Date Taking? Authorizing Provider  allopurinol (ZYLOPRIM) 300 MG tablet Take 1 tablet (300 mg total) by mouth daily. 11/25/20   Eldred Manges, MD  azelastine (ASTELIN) 0.1 % nasal spray Place 2 sprays into both nostrils 2 (two) times daily. Use in each nostril as directed 03/10/17   Porfirio Oar, PA   azithromycin (ZITHROMAX) 250 MG tablet Take 2 tabs PO x 1 dose, then 1 tab PO QD x 4 days Patient not taking: Reported on 02/18/2018 03/10/17   Porfirio Oar, PA  benzonatate (TESSALON) 100 MG capsule Take 1-2 capsules (100-200 mg total) by mouth 3 (hemorrhage to angiectasis) times daily as needed for cough. 02/18/18   Peyton Najjar, MD  doxycycline (VIBRA-TABS) 100 MG tablet Take 1 tablet (100 mg total) by mouth 2 (two) times daily. 02/18/18   Peyton Najjar, MD  IRON PO Take 45 mg by mouth.    [provider]  naproxen (NAPROSYN) 500 MG tablet Take 1 tablet (500 mg total) by mouth 2 (two) times daily with a meal. 11/25/20   Eldred Manges, MD    Allergies:  Allergies  Allergen Reactions   Other Other (See Comments)    **Cannot take blood thinners(HHT) - causes anemia   Penicillins Hives    Hives in HS, but takes Amoxicillin before dental procedures without reaction   Sulfa Antibiotics Hives    Past Medical History: Past Medical History:  Diagnosis Date   HHT (hereditary hemorrhagic telangiectasia) (HCC)     Past Surgical History:  Procedure Laterality Date   BRAIN SURGERY     LUNG SURGERY      Social History: No history of smoking alcohol use or illicit drug use.  Family History:  Family History  Problem Relation Age of Onset   Healthy Mother  Healthy Sister    Stroke Maternal Grandfather      Review of Systems - History obtained from the patient General ROS: positive for  - fatigue and malaise Psychological ROS: negative Ophthalmic ROS: negative ENT ROS: As in HPI Allergy and Immunology ROS: negative Hematological and Lymphatic ROS: positive for - bleeding problems Endocrine ROS: negative Respiratory ROS: As in HPI Cardiovascular ROS: no chest pain or dyspnea on exertion Gastrointestinal ROS: no abdominal pain, change in bowel habits, or black or bloody stools Genito-Urinary ROS: no dysuria, trouble voiding, or hematuria Musculoskeletal ROS:  negative Neurological ROS: no TIA or stroke symptoms Dermatological ROS: negative  Physical Examination  Vitals:   11/25/21 1334 11/25/21 1440 11/25/21 1445 11/25/21 1548  BP: (!) 155/93  133/82 (!) 142/92  Pulse: 90  79 81  Resp: 18  19 (!) 26  Temp: 98 F (36.7 C)     TempSrc: Oral     SpO2: 98%  100% 100%  Weight:  103.1 kg    Height:  5\' 11"  (1.803 m)      BP (!) 142/92   Pulse 81   Temp 98 F (36.7 C) (Oral)   Resp (!) 26   Ht 5\' 11"  (1.803 m)   Wt 103.1 kg   SpO2 100%   BMI 31.70 kg/m   General appearance: alert, cooperative, appears stated age, no distress, and pale Head: Normocephalic, without obvious abnormality, atraumatic Eyes: conjunctivae/corneas clear. PERRL, EOM's intact.  Throat: lips, mucosa, and tongue normal; teeth and gums normal Neck: no adenopathy, no carotid bruit, no JVD, supple, symmetrical, trachea midline, and thyroid not enlarged, symmetric, no tenderness/mass/nodules Resp: clear to auscultation bilaterally Cardio: regular rate and rhythm, S1, S2 normal, no murmur, click, rub or gallop GI: soft, non-tender; bowel sounds normal; no masses,  no organomegaly Extremities: extremities normal, atraumatic, no cyanosis or edema Pulses: 2+ and symmetric Skin: Skin color, texture, turgor normal. No rashes or lesions Lymph nodes: Cervical, supraclavicular, and axillary nodes normal. Neurologic: Alert and oriented x3.  Cranial nerves II to XII intact.  Motor strength equal bilateral upper and lower extremities.   Labs on Admission: I have personally reviewed following labs and imaging studies  CBC: Recent Labs  Lab 11/25/21 0958  WBC 8.0  NEUTROABS 7.8*  HGB 7.0*  HCT 26.5*  MCV 60.9*  PLT 274   Basic Metabolic Panel: Recent Labs  Lab 11/25/21 0958  NA 139  K 4.5  CL 106  CO2 23  GLUCOSE 153*  BUN 12  CREATININE 1.31*  CALCIUM 9.5   GFR: Estimated Creatinine Clearance: 82.4 mL/min (A) (by C-G formula based on SCr of 1.31 mg/dL  (H)). Liver Function Tests: Recent Labs  Lab 11/25/21 0958  AST 25  ALT 21  ALKPHOS 82  BILITOT 0.8  PROT 7.6  ALBUMIN 4.2    Anemia Panel: Recent Labs    11/25/21 0958  VITAMINB12 399  FOLATE 22.0  FERRITIN 5*  TIBC 598*  IRON 16*  RETICCTPCT 1.7     Radiological Exams on Admission: No results found.    Problem List  Principal Problem:   Symptomatic anemia Active Problems:   Osler-Weber-Rendu syndrome (HCC)   Iron deficiency anemia   Heme positive stool   Assessment: This is a 51 year old Caucasian male with a past medical history as stated earlier who presents with fatigue malaise cold-like symptoms who was found to be profoundly anemic in the urgent care center.  Heme positive stool is noted on rectal examination.  He is profoundly iron deficient.  Concern is for slow GI blood loss in the setting of a history of HHT.  Plan:  #1. Symptomatic anemia/iron deficiency: Heme positive stool noted.  Anemia panel shows ferritin of 5, iron of 16, TIBC of 598% saturation of 3.  Folate is 22 and vitamin B12 399.  Patient will be transfused 1 unit of PRBC.  He will be given IV infusions.  No recent blood work in our system.  Last hemoglobin was 14.1 in October 2019.  #2. Heme positive stool: He has never had a GI evaluation previously.  He has a history of HHT.  Could have GI AVMs.  EDP is consulting gastroenterology.  We will place him on PPI for now.  Clear liquid diet.  #3. History of HHT/Osler-Weber-Rendu syndrome.  Followed at [facility].  Occasional nasal bleeding.  He has had a pulmonary AVM and brain AVM previously.  #4. Upper respiratory tract infection: Home COVID test was negative.  We will repeat a PCR test here.  Chest x-ray will be ordered.  He is noted to be afebrile.  His WBC is normal.  He was prescribed a prednisone taper by the urgent care.  We will hold it for now.  #5. Mildly elevated creatinine: No previous creatinine in our system.  Gentle hydration.   Recheck tomorrow.  Monitor urine output.  Avoid nephrotoxic agents.   DVT Prophylaxis: SCDs Code Status: Full code Family Communication: Discussed with patient and his wife Disposition: Hopefully return home in improved Consults called: [facility] Admission Status: Status is: Observation The patient remains OBS appropriate and will d/c before 2 midnights.    Severity of Illness: The appropriate patient status for this patient is OBSERVATION. Observation status is judged to be reasonable and necessary in order to provide the required intensity of service to ensure the patient's safety. The patient's presenting symptoms, physical exam findings, and initial radiographic and laboratory data in the context of their medical condition is felt to place them at decreased risk for further clinical deterioration. Furthermore, it is anticipated that the patient will be medically stable for discharge from the hospital within 2 midnights of admission.    Further management decisions will depend on results of further testing and patient's response to treatment.   Kalley Nicholl [employer]  [facility] operational officer on [email or web address]  11/25/2021, 4:11 PM

## 2021-11-25 NOTE — ED Provider Triage Note (Addendum)
Emergency Medicine Provider Triage Evaluation Note  Marcus Ball , a 51 y.o. male  was evaluated in triage.  Pt complains of sinus congestion and symptoms of anemia. Hx of hereditary hemorrhagic telangiectasias (HHT), follows at [facility]. Went to urgent care for sinus symptoms and they drew some blood work due to patient looking pale, and reported his hemoglobin was 6.8. He has been craving ice for several months, and recently got back from vacation and has been sleeping more than normal. Has never had problems with his hemoglobin before. Hx of AVM in his lungs and brain, s/p surgery on both.  Review of Systems  Positive: Fatigue, pallor, nosebleeds Negative: Fever, dyspnea on exertion, blood in stool  Physical Exam  BP (!) 147/89 (BP Location: Right Arm)   Pulse 95   Temp 98.3 F (36.8 C) (Oral)   Resp 20   SpO2 100%  Gen:   Awake, no distress   Resp:  Normal effort  MSK:   Moves extremities without difficulty  Other:  Pale skin and mucosa  Medical Decision Making  Medically screening exam initiated at 9:48 AM.  Appropriate orders placed.  Marcus Ball was informed that the remainder of the evaluation will be completed by another provider, this initial triage assessment does not replace that evaluation, and the importance of remaining in the ED until their evaluation is complete.  Will order anemia panel and type and screen   Oisin Yoakum T, PA-C 11/25/21 0951    Aryah Doering T, PA-C 11/25/21 1023

## 2021-11-25 NOTE — H&P (View-Only) (Signed)
Attending physician's note   I have taken a history, reviewed the chart, and examined the patient. I performed a substantive portion of this encounter, including complete performance of at least one of the key components, in conjunction with the APP. I agree with the APP's note, impression, and recommendations with my edits.   Patient is a 51 year old male with a history as outlined below to include history of HHT c/b [place] and pulmonary AVMs requiring prior surgery.  No prior known history of AVMs in the GI tract.  No previous EGD or colonoscopy.  Presents with symptomatic iron deficiency anemia.  Denies overt GI bleeding but FOBT positive in the ER.  - Transfusing PRBCs in ER - Clears okay for now with plan for n.p.o. at midnight - Bowel prep this evening - Plan for EGD/colonoscopy tomorrow for diagnostic and therapeutic intent - Continue serial CBC checks with additional blood products as needed per protocol  The indications, risks, and benefits of EGD and colonoscopy were explained to the patient in detail. Risks include but are not limited to bleeding, perforation, adverse reaction to medications, and cardiopulmonary compromise. Sequelae include but are not limited to the possibility of surgery, hospitalization, and mortality. The patient verbalized understanding and wished to proceed. All questions answered. Further recommendations pending results of the exam.    Doristine Locks, DO, FACG [phone] office                                                                                   [facility] Consult: 4:01 PM 11/25/2021  LOS: 0 days    Referring Provider: Dr Particia Nearing  in ED  Primary Care Physician:  Patient, No Pcp Per Primary Gastroenterologist:  None      Reason for Consultation:  Hgb 6.8   HPI: Marcus Ball is a 51 y.o. male. 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  Craniotomy in 2013.  Lung surgery, date not clear.  Previously cared for at [facility]    Reported to urgent care for assessment of sinus congestion, looked pale and Hgb drawn resulting at 6.8.  Patient endorses ice pica.  Some fatigue with activity in last several days.  Few episodes of dizziness.  Stools are brown.  Appetite is good.  No abdominal pain.  No nausea or vomiting.  2 or 3 days a week he sees blood coming out his nasal passages.  This will happen after he blows his nose.  It is never vigorous bleeding and stops quickly.  Does not use aspirin or nonsteroidals.    Iron is 16, ferritin is 5.  MCV 60.  Platelets, WBCs normal. BUN normal, creatinine 1.3.   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  Does not drink or smoke.  Married. Maternal grandmother had stomach cancer diagnosed postmortem when she was in her 15s.   Past Medical History:  Diagnosis Date   HHT (hereditary hemorrhagic telangiectasia) (HCC)     Past Surgical History:  Procedure Laterality Date   BRAIN SURGERY     LUNG SURGERY      Prior to Admission medications   Medication Sig  Start Date End Date Taking? Authorizing Provider  allopurinol (ZYLOPRIM) 300 MG tablet Take 1 tablet (300 mg total) by mouth daily. 11/25/20   Eldred Manges, MD  azelastine (ASTELIN) 0.1 % nasal spray Place 2 sprays into both nostrils 2 (two) times daily. Use in each nostril as directed 03/10/17   Porfirio Oar, PA  azithromycin (ZITHROMAX) 250 MG tablet Take 2 tabs PO x 1 dose, then 1 tab PO QD x 4 days Patient not taking: Reported on 02/18/2018 03/10/17   Porfirio Oar, PA  benzonatate (TESSALON) 100 MG capsule Take 1-2 capsules (100-200 mg total) by mouth 3 (three) times daily as needed for cough. 02/18/18   Peyton Najjar, MD  doxycycline (VIBRA-TABS) 100 MG tablet Take 1 tablet (100 mg total) by mouth 2 (two) times daily. 02/18/18   Peyton Najjar,  MD  IRON PO Take 45 mg by mouth.    [provider]  naproxen (NAPROSYN) 500 MG tablet Take 1 tablet (500 mg total) by mouth 2 (two) times daily with a meal. 11/25/20   Eldred Manges, MD    Scheduled Meds:  Infusions:  sodium chloride     PRN Meds:    Allergies as of 11/25/2021 - Review Complete 11/25/2021  Allergen Reaction Noted   Other Other (See Comments) 06/29/2011   Penicillins Hives 02/24/2012   Sulfa antibiotics Hives 02/24/2012    Family History  Problem Relation Age of Onset   Healthy Mother    Healthy Sister    Stroke Maternal Grandfather     Social History   Socioeconomic History   Marital status: Married    Spouse name: Not on file   Number of children: 3   Years of education: Master's Degree   Highest education level: Not on file  Occupational History   Occupation: Catering manager: [employer]  Tobacco Use   Smoking status: Never   Smokeless tobacco: Never  Vaping Use   Vaping Use: Never used  Substance and Sexual Activity   Alcohol use: No   Drug use: No   Sexual activity: Yes    Partners: Female  Other Topics Concern   Not on file  Social History Narrative   Lives with his wife and 2 children.   Oldest child is adult and lives independently.   Social Determinants of Health   Financial Resource Strain: Not on file  Food Insecurity: Not on file  Transportation Needs: Not on file  Physical Activity: Not on file  Stress: Not on file  Social Connections: Not on file  Intimate Partner Violence: Not on file    REVIEW OF SYSTEMS: Constitutional: As per HPI. ENT: Minor epistaxis, bleeding per nostrils as above Pulm: No dyspnea or cough.  CV:  No palpitations, no LE edema.  GU:  No hematuria, no frequency GI: See HPI Heme: No unusual bleeding or bruising Transfusions: None Neuro:  No headaches, no peripheral tingling or numbness.  No syncope, no seizures. Derm:  No itching, no rash or sores.   Endocrine:  No sweats or chills.  No polyuria or dysuria Immunization: Reviewed. Travel: Just returned from a beach vacation.   PHYSICAL EXAM: Vital signs in last 24 hours: Vitals:   11/25/21 1445 11/25/21 1548  BP: 133/82 (!) 142/92  Pulse: 79 81  Resp: 19 (!) 26  Temp:    SpO2: 100% 100%   Wt Readings from Last 3 Encounters:  11/25/21 103.1 kg  02/18/18 103.1 kg  03/10/17  103 kg    General: Patient is pale but appears otherwise well.  He is comfortable and alert. Head: No facial asymmetry or swelling.  No signs of head trauma. Eyes: Conjunctiva is pale Ears: Not hard of hearing Nose: No congestion or discharge Mouth: No oral telangiectasia.  Mucosa is moist, pink, clear.  Tongue midline.  Good dentition. Neck: No JVD, masses, thyromegaly Lungs: Clear bilaterally.  No labored breathing or cough. Heart: RRR.  No MRG.  S1, S2 present Abdomen: No masses, HSM, bruits, hernias.  No distention.  Active bowel sounds.  No tenderness.   Rectal: Did not repeat this exam.  It was performed by ED staff and was FOBT positive Musc/Skeltl: No joint redness, swelling or gross deformity. Extremities: No CCE Neurologic: Oriented x3.  Fully alert.  No confusion.  No tremor or limb weakness Skin: No obvious dermatologic telangiectasia. Nodes: No cervical adenopathy Psych: Calm, pleasant, cooperative.  Intake/Output from previous day: No intake/output data recorded. Intake/Output this shift: No intake/output data recorded.  LAB RESULTS: Recent Labs    11/25/21 0958  WBC 8.0  HGB 7.0*  HCT 26.5*  PLT 274   BMET Lab Results  Component Value Date   NA 139 11/25/2021   K 4.5 11/25/2021   CL 106 11/25/2021   CO2 23 11/25/2021   GLUCOSE 153 (H) 11/25/2021   BUN 12 11/25/2021   CREATININE 1.31 (H) 11/25/2021   CALCIUM 9.5 11/25/2021   LFT Recent Labs    11/25/21 0958  PROT 7.6  ALBUMIN 4.2  AST 25  ALT 21  ALKPHOS 82  BILITOT 0.8   PT/INR No results found for:  "INR", "PROTIME" Hepatitis Panel No results for input(s): "HEPBSAG", "HCVAB", "HEPAIGM", "HEPBIGM" in the last 72 hours. C-Diff No components found for: "CDIFF" Lipase  No results found for: "LIPASE"  Drugs of Abuse  No results found for: "LABOPIA", "COCAINSCRNUR", "LABBENZ", "AMPHETMU", "THCU", "LABBARB"   RADIOLOGY STUDIES: No results found.    IMPRESSION:   Iron deficiency anemia in patient with hereditary telangiectasia.  These have involved lung and brain in the past status post craniotomy and lung surgery.  Rule out AVMs of the gastrointestinal tract.    PLAN:     Transfuse as ordered.  Plan colonoscopy and EGD tomorrow.  Patient agreeable.  See orders.  Follow-up CBC after transfusions completed.   Jennye Moccasin  11/25/2021, 4:01 PM Phone [phone]

## 2021-11-25 NOTE — Consult Note (Addendum)
  Attending physician's note   I have taken a history, reviewed the chart, and examined the patient. I performed a substantive portion of this encounter, including complete performance of at least one of the key components, in conjunction with the APP. I agree with the APP's note, impression, and recommendations with my edits.   Patient is a 51-year-old male with a history as outlined below to include history of Marcus c/b Brein and pulmonary AVMs requiring prior surgery.  No prior known history of AVMs in the GI tract.  No previous EGD or colonoscopy.  Presents with symptomatic iron deficiency anemia.  Denies overt GI bleeding but FOBT positive in the ER.  - Transfusing PRBCs in ER - Clears okay for now with plan for n.p.o. at midnight - Bowel prep this evening - Plan for EGD/colonoscopy tomorrow for diagnostic and therapeutic intent - Continue serial CBC checks with additional blood products as needed per protocol  The indications, risks, and benefits of EGD and colonoscopy were explained to the patient in detail. Risks include but are not limited to bleeding, perforation, adverse reaction to medications, and cardiopulmonary compromise. Sequelae include but are not limited to the possibility of surgery, hospitalization, and mortality. The patient verbalized understanding and wished to proceed. All questions answered. Further recommendations pending results of the exam.    Marcus Hochmuth, DO, FACG [phone] office                                                                                   [facility] Consult: 4:01 PM 11/25/2021  LOS: 0 days    Referring Provider: Dr Haviland  in ED  Primary Care Physician:  Patient, No Pcp Per Primary Gastroenterologist:  None      Reason for Consultation:  Hgb 6.8   HPI: Marcus Ball is a 50 y.o. male. Hx hereditary  hemorrhagic telangiectasia with AVMs of the lungs and Marcus requiring surgery.  Craniotomy in 2013.  Lung surgery, date not clear.  Previously cared for at [facility]..    Reported to urgent care for assessment of sinus congestion, looked pale and Hgb drawn resulting at 6.8.  Patient endorses ice pica.  Some fatigue with activity in last several days.  Few episodes of dizziness.  Stools are brown.  Appetite is good.  No abdominal pain.  No nausea or vomiting.  2 or 3 days a week he sees blood coming out his nasal passages.  This will happen after he blows his nose.  It is never vigorous bleeding and stops quickly.  Does not use aspirin or nonsteroidals.    Iron is 16, ferritin is 5.  MCV 60.  Platelets, WBCs normal. BUN normal, creatinine 1.3.   Works as a principal at a local elementary school.  Does not drink or smoke.  Married. Maternal grandmother had stomach cancer diagnosed postmortem when she was in her 80s.   Past Medical History:  Diagnosis Date   Marcus (hereditary hemorrhagic telangiectasia) (HCC)     Past Surgical History:  Procedure Laterality Date   Marcus SURGERY     LUNG SURGERY      Prior to Admission medications   Medication Sig   Start Date End Date Taking? Authorizing Provider  allopurinol (ZYLOPRIM) 300 MG tablet Take 1 tablet (300 mg total) by mouth daily. 11/25/20   Eldred Manges, MD  azelastine (ASTELIN) 0.1 % nasal spray Place 2 sprays into both nostrils 2 (two) times daily. Use in each nostril as directed 03/10/17   Porfirio Oar, PA  azithromycin (ZITHROMAX) 250 MG tablet Take 2 tabs PO x 1 dose, then 1 tab PO QD x 4 days Patient not taking: Reported on 02/18/2018 03/10/17   Porfirio Oar, PA  benzonatate (TESSALON) 100 MG capsule Take 1-2 capsules (100-200 mg total) by mouth 3 (three) times daily as needed for cough. 02/18/18   Peyton Najjar, MD  doxycycline (VIBRA-TABS) 100 MG tablet Take 1 tablet (100 mg total) by mouth 2 (two) times daily. 02/18/18   Peyton Najjar,  MD  IRON PO Take 45 mg by mouth.    [provider]  naproxen (NAPROSYN) 500 MG tablet Take 1 tablet (500 mg total) by mouth 2 (two) times daily with a meal. 11/25/20   Eldred Manges, MD    Scheduled Meds:  Infusions:  sodium chloride     PRN Meds:    Allergies as of 11/25/2021 - Review Complete 11/25/2021  Allergen Reaction Noted   Other Other (See Comments) 06/29/2011   Penicillins Hives 02/24/2012   Sulfa antibiotics Hives 02/24/2012    Family History  Problem Relation Age of Onset   Healthy Mother    Healthy Sister    Stroke Maternal Grandfather     Social History   Socioeconomic History   Marital status: Married    Spouse name: Not on file   Number of children: 3   Years of education: Master's Degree   Highest education level: Not on file  Occupational History   Occupation: Catering manager: [employer]  Tobacco Use   Smoking status: Never   Smokeless tobacco: Never  Vaping Use   Vaping Use: Never used  Substance and Sexual Activity   Alcohol use: No   Drug use: No   Sexual activity: Yes    Partners: Female  Other Topics Concern   Not on file  Social History Narrative   Lives with his wife and 2 children.   Oldest child is adult and lives independently.   Social Determinants of Health   Financial Resource Strain: Not on file  Food Insecurity: Not on file  Transportation Needs: Not on file  Physical Activity: Not on file  Stress: Not on file  Social Connections: Not on file  Intimate Partner Violence: Not on file    REVIEW OF SYSTEMS: Constitutional: As per HPI. ENT: Minor epistaxis, bleeding per nostrils as above Pulm: No dyspnea or cough.  CV:  No palpitations, no LE edema.  GU:  No hematuria, no frequency GI: See HPI Heme: No unusual bleeding or bruising Transfusions: None Neuro:  No headaches, no peripheral tingling or numbness.  No syncope, no seizures. Derm:  No itching, no rash or sores.   Endocrine:  No sweats or chills.  No polyuria or dysuria Immunization: Reviewed. Travel: Just returned from a beach vacation.   PHYSICAL EXAM: Vital signs in last 24 hours: Vitals:   11/25/21 1445 11/25/21 1548  BP: 133/82 (!) 142/92  Pulse: 79 81  Resp: 19 (!) 26  Temp:    SpO2: 100% 100%   Wt Readings from Last 3 Encounters:  11/25/21 103.1 kg  02/18/18 103.1 kg  03/10/17  103 kg    General: Patient is pale but appears otherwise well.  He is comfortable and alert. Head: No facial asymmetry or swelling.  No signs of head trauma. Eyes: Conjunctiva is pale Ears: Not hard of hearing Nose: No congestion or discharge Mouth: No oral telangiectasia.  Mucosa is moist, pink, clear.  Tongue midline.  Good dentition. Neck: No JVD, masses, thyromegaly Lungs: Clear bilaterally.  No labored breathing or cough. Heart: RRR.  No MRG.  S1, S2 present Abdomen: No masses, HSM, bruits, hernias.  No distention.  Active bowel sounds.  No tenderness.   Rectal: Did not repeat this exam.  It was performed by ED staff and was FOBT positive Musc/Skeltl: No joint redness, swelling or gross deformity. Extremities: No CCE Neurologic: Oriented x3.  Fully alert.  No confusion.  No tremor or limb weakness Skin: No obvious dermatologic telangiectasia. Nodes: No cervical adenopathy Psych: Calm, pleasant, cooperative.  Intake/Output from previous day: No intake/output data recorded. Intake/Output this shift: No intake/output data recorded.  LAB RESULTS: Recent Labs    11/25/21 0958  WBC 8.0  HGB 7.0*  HCT 26.5*  PLT 274   BMET Lab Results  Component Value Date   NA 139 11/25/2021   K 4.5 11/25/2021   CL 106 11/25/2021   CO2 23 11/25/2021   GLUCOSE 153 (H) 11/25/2021   BUN 12 11/25/2021   CREATININE 1.31 (H) 11/25/2021   CALCIUM 9.5 11/25/2021   LFT Recent Labs    11/25/21 0958  PROT 7.6  ALBUMIN 4.2  AST 25  ALT 21  ALKPHOS 82  BILITOT 0.8   PT/INR No results found for:  "INR", "PROTIME" Hepatitis Panel No results for input(s): "HEPBSAG", "HCVAB", "HEPAIGM", "HEPBIGM" in the last 72 hours. C-Diff No components found for: "CDIFF" Lipase  No results found for: "LIPASE"  Drugs of Abuse  No results found for: "LABOPIA", "COCAINSCRNUR", "LABBENZ", "AMPHETMU", "THCU", "LABBARB"   RADIOLOGY STUDIES: No results found.    IMPRESSION:   Iron deficiency anemia in patient with hereditary telangiectasia.  These have involved lung and Marcus in the past status post craniotomy and lung surgery.  Rule out AVMs of the gastrointestinal tract.    PLAN:     Transfuse as ordered.  Plan colonoscopy and EGD tomorrow.  Patient agreeable.  See orders.  Follow-up CBC after transfusions completed.   Jennye Moccasin  11/25/2021, 4:01 PM Phone [phone]

## 2021-11-26 ENCOUNTER — Observation Stay (HOSPITAL_COMMUNITY): Payer: BC Managed Care – PPO | Admitting: Certified Registered Nurse Anesthetist

## 2021-11-26 ENCOUNTER — Encounter (HOSPITAL_COMMUNITY)
Admission: EM | Disposition: A | Discharge status: Home / Self Care | Payer: Self-pay | Source: Home / Self Care | Attending: Emergency Medicine

## 2021-11-26 ENCOUNTER — Encounter (HOSPITAL_COMMUNITY): Payer: Self-pay | Admitting: Internal Medicine

## 2021-11-26 DIAGNOSIS — K5730 Diverticulosis of large intestine without perforation or abscess without bleeding: Secondary | ICD-10-CM | POA: Diagnosis not present

## 2021-11-26 DIAGNOSIS — Z1211 Encounter for screening for malignant neoplasm of colon: Secondary | ICD-10-CM | POA: Diagnosis not present

## 2021-11-26 DIAGNOSIS — K641 Second degree hemorrhoids: Secondary | ICD-10-CM

## 2021-11-26 DIAGNOSIS — D649 Anemia, unspecified: Secondary | ICD-10-CM | POA: Diagnosis not present

## 2021-11-26 DIAGNOSIS — K31819 Angiodysplasia of stomach and duodenum without bleeding: Secondary | ICD-10-CM | POA: Diagnosis not present

## 2021-11-26 DIAGNOSIS — R195 Other fecal abnormalities: Secondary | ICD-10-CM | POA: Diagnosis not present

## 2021-11-26 HISTORY — PX: COLONOSCOPY WITH PROPOFOL: SHX5780

## 2021-11-26 HISTORY — PX: HEMOSTASIS CLIP PLACEMENT: SHX6857

## 2021-11-26 HISTORY — PX: ESOPHAGOGASTRODUODENOSCOPY (EGD) WITH PROPOFOL: SHX5813

## 2021-11-26 HISTORY — PX: HOT HEMOSTASIS: SHX5433

## 2021-11-26 LAB — CBC
HCT: 26.6 % — ABNORMAL LOW (ref 39.0–52.0)
Hemoglobin: 7.1 g/dL — ABNORMAL LOW (ref 13.0–17.0)
MCH: 17.4 pg — ABNORMAL LOW (ref 26.0–34.0)
MCHC: 26.7 g/dL — ABNORMAL LOW (ref 30.0–36.0)
MCV: 65.4 fL — ABNORMAL LOW (ref 80.0–100.0)
Platelets: 198 10*3/uL (ref 150–400)
RBC: 4.07 MIL/uL — ABNORMAL LOW (ref 4.22–5.81)
RDW: 23.2 % — ABNORMAL HIGH (ref 11.5–15.5)
WBC: 9.4 10*3/uL (ref 4.0–10.5)
nRBC: 0 % (ref 0.0–0.2)

## 2021-11-26 LAB — COMPREHENSIVE METABOLIC PANEL
ALT: 18 U/L (ref 0–44)
AST: 19 U/L (ref 15–41)
Albumin: 3.5 g/dL (ref 3.5–5.0)
Alkaline Phosphatase: 76 U/L (ref 38–126)
Anion gap: 9 (ref 5–15)
BUN: 11 mg/dL (ref 6–20)
CO2: 19 mmol/L — ABNORMAL LOW (ref 22–32)
Calcium: 8.6 mg/dL — ABNORMAL LOW (ref 8.9–10.3)
Chloride: 113 mmol/L — ABNORMAL HIGH (ref 98–111)
Creatinine, Ser: 1.14 mg/dL (ref 0.61–1.24)
GFR, Estimated: >60 mL/min (ref >60)
Glucose, Bld: 104 mg/dL — ABNORMAL HIGH (ref 70–99)
Potassium: 4 mmol/L (ref 3.5–5.1)
Sodium: 141 mmol/L (ref 135–145)
Total Bilirubin: 0.8 mg/dL (ref 0.3–1.2)
Total Protein: 6.6 g/dL (ref 6.5–8.1)

## 2021-11-26 LAB — HIV ANTIBODY (ROUTINE TESTING W REFLEX): HIV Screen 4th Generation wRfx: NONREACTIVE

## 2021-11-26 LAB — PREPARE RBC (CROSSMATCH)

## 2021-11-26 SURGERY — ESOPHAGOGASTRODUODENOSCOPY (EGD) WITH PROPOFOL
Anesthesia: Monitor Anesthesia Care

## 2021-11-26 MED ORDER — PANTOPRAZOLE SODIUM 20 MG PO TBEC
20.0000 mg | DELAYED_RELEASE_TABLET | Freq: Every day | ORAL | Status: DC
  Administered 2021-11-26 – 2021-11-27 (×2): 20 mg via ORAL
  Filled 2021-11-26 (×2): qty 1

## 2021-11-26 MED ORDER — PROPOFOL 10 MG/ML IV BOLUS
INTRAVENOUS | Status: DC | PRN
  Administered 2021-11-26: 40 mg via INTRAVENOUS
  Administered 2021-11-26: 20 mg via INTRAVENOUS
  Administered 2021-11-26 (×3): 30 mg via INTRAVENOUS

## 2021-11-26 MED ORDER — PROPOFOL 500 MG/50ML IV EMUL
INTRAVENOUS | Status: DC | PRN
  Administered 2021-11-26 (×2): 175 ug/kg/min via INTRAVENOUS
  Administered 2021-11-26: 125 ug/kg/min via INTRAVENOUS

## 2021-11-26 MED ORDER — LIDOCAINE 2% (20 MG/ML) 5 ML SYRINGE
INTRAMUSCULAR | Status: DC | PRN
  Administered 2021-11-26: 40 mg via INTRAVENOUS

## 2021-11-26 MED ORDER — HYDROMORPHONE HCL 1 MG/ML IJ SOLN: 0.2500 mg | INTRAMUSCULAR | Status: DC | PRN

## 2021-11-26 MED ORDER — LACTATED RINGERS IV SOLN: INTRAVENOUS | Status: DC | PRN

## 2021-11-26 MED ORDER — OXYCODONE HCL 5 MG/5ML PO SOLN: 5.0000 mg | Freq: Once | ORAL | Status: DC | PRN

## 2021-11-26 MED ORDER — OXYCODONE HCL 5 MG PO TABS: 5.0000 mg | ORAL_TABLET | Freq: Once | ORAL | Status: DC | PRN

## 2021-11-26 MED ORDER — SODIUM CHLORIDE 0.9% IV SOLUTION: Freq: Once | INTRAVENOUS | Status: AC

## 2021-11-26 MED ORDER — PROMETHAZINE HCL 25 MG/ML IJ SOLN: 6.2500 mg | INTRAMUSCULAR | Status: DC | PRN

## 2021-11-26 SURGICAL SUPPLY — 25 items

## 2021-11-26 NOTE — Anesthesia Procedure Notes (Signed)
Procedure Name: MAC Date/Time: 11/26/2021 11:22 AM  Performed by: Janace Litten, CRNAPre-anesthesia Checklist: Patient identified, Emergency Drugs available, Suction available and Patient being monitored Oxygen Delivery Method: Nasal cannula

## 2021-11-26 NOTE — Op Note (Signed)
[facility] Patient Name: Marcus Ball Procedure Date : 11/26/2021 MRN: [id number] Attending MD: Marcus Ball , MD Date of Birth: November 19, 1970 CSN: [id number] Age: 51 Admit Type: Inpatient Procedure:                Colonoscopy Indications:              This is the patient's first colonoscopy, Heme                            positive stool, Iron deficiency anemia                           51 year old male with a history HHT c/b brain and                            pulmonary AVMs requiring prior surgery, admitted                            with symptomatic iron deficiency anemia. Denies                            overt GI bleeding but FOBT positive in the ER. No                            previous EGD or colonoscopy. No prior known history                            of GI AVMs. Providers:                Marcus Locks, MD, Marcus Pollack, RN, Marcus Ball, Technician Referring MD:              Medicines:                Monitored Anesthesia Care Complications:            No immediate complications. Estimated Blood Loss:     Estimated blood loss: none. Procedure:                Pre-Anesthesia Assessment:                           - Prior to the procedure, a History and Physical                            was performed, and patient medications and                            allergies were reviewed. The patient's tolerance of                            previous anesthesia was also reviewed. The risks                            and benefits of  the procedure and the sedation                            options and risks were discussed with the patient.                            All questions were answered, and informed consent                            was obtained. Prior Anticoagulants: The patient has                            taken no previous anticoagulant or antiplatelet                            agents. ASA Grade Assessment: II -  A patient with                            mild systemic disease. After reviewing the risks                            and benefits, the patient was deemed in                            satisfactory condition to undergo the procedure.                           After obtaining informed consent, the colonoscope                            was passed under direct vision. Throughout the                            procedure, the patient's blood pressure, pulse, and                            oxygen saturations were monitored continuously. The                            [id number] Olympus coloscope was                            introduced through the anus and advanced to the the                            terminal ileum. The colonoscopy was performed                            without difficulty. The patient tolerated the                            procedure well. The quality of the bowel  preparation was good. The terminal ileum, ileocecal                            valve, appendiceal orifice, and rectum were                            photographed. Scope In: 11:48:29 AM Scope Out: 12:01:18 PM Scope Withdrawal Time: 0 hours 10 minutes 35 seconds  Total Procedure Duration: 0 hours 12 minutes 49 seconds  Findings:      The perianal and digital rectal examinations were normal.      A few small-mouthed diverticula were found in the sigmoid colon.      Non-bleeding internal hemorrhoids were found during retroflexion. The       hemorrhoids were small and Grade II (internal hemorrhoids that prolapse       but reduce spontaneously).      The exam was otherwise normal throughout the remainder of the colon.      The terminal ileum appeared normal. Impression:               - Diverticulosis in the sigmoid colon.                           - Non-bleeding internal hemorrhoids.                           - The examined portion of the ileum was normal.                            - No specimens collected. Recommendation:           - Return patient to hospital ward for possible                            discharge same day.                           - Advance diet as tolerated.                           - Continue present medications.                           - Repeat CBC 7-10 days after hospital discharge.                           - Would benefit from an additional 1 unit of PRBCs                            along with IV iron prior to hospital discharge                           - Will schedule follow-up in the GI clinic                           - Repeat iron panel 2 months after hospital  discharge                           - If continued iron deficiency anemia as                            outpatient, plan for additional small bowel                            interrogation with Video Capsule Endoscopy (VCE)                           I discussed these findings and recommendations with                            the patient along with his wife by phone per his                            request. Procedure Code(s):        --- Professional ---                           [phone], Colonoscopy, flexible; diagnostic, including                            collection of specimen(s) by brushing or washing,                            when performed (separate procedure) Diagnosis Code(s):        --- Professional ---                           K64.1, Second degree hemorrhoids                           R19.5, Other fecal abnormalities                           D50.9, Iron deficiency anemia, unspecified                           K57.30, Diverticulosis of large intestine without                            perforation or abscess without bleeding CPT copyright 2019 American Medical Association. All rights reserved. The codes documented in this report are preliminary and upon coder review may  be revised to meet current compliance requirements. Marcus Heck, MD 11/26/2021 12:34:01 PM Number of Addenda: 0

## 2021-11-26 NOTE — Op Note (Signed)
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 Denies                            overt GI bleeding but FOBT positive in the ER. No                            previous EGD or colonoscopy. No prior known history                            of GI AVMs. Providers:                Gerrit Heck, MD, Doristine Johns, RN, Frazier Richards, Technician Referring MD:              Medicines:                Monitored Anesthesia Care Complications:            No immediate complications. Estimated Blood Loss:     Estimated blood loss was minimal. Procedure:                Pre-Anesthesia Assessment:                           - Prior to the procedure, a History and Physical                            was performed, and patient medications and                            allergies were reviewed. The patient's tolerance of                            previous anesthesia was also reviewed. The risks                            and benefits of the procedure and the sedation  options and risks were discussed with the patient.                            All questions were answered, and informed consent                            was obtained. Prior Anticoagulants: The patient has                            taken no previous anticoagulant or antiplatelet                            agents. ASA Grade Assessment: II - A patient with                            mild systemic  disease. After reviewing the risks                            and benefits, the patient was deemed in                            satisfactory condition to undergo the procedure.                           After obtaining informed consent, the endoscope was                            passed under direct vision. Throughout the                            procedure, the patient's blood pressure, pulse, and                            oxygen saturations were monitored continuously. The                            [id number] Olympus endoscope was introduced                            through the mouth, and advanced to the fourth part                            of duodenum. The upper GI endoscopy was                            accomplished without difficulty. The patient                            tolerated the procedure well. Scope In: Scope Out: Findings:      A 2 cm hiatal hernia was present.      The upper third of the esophagus, middle third of the esophagus and       lower third of the esophagus were normal.      The entire  examined stomach was normal.      11 small angioectasias with typical arborization were found in the       entire duodenum. Coagulation for hemostasis using argon plasma at 0.5       liters/minute and 20 watts was successful. Estimated blood loss was       minimal. There was mild oozing at one f of the APC sites in the 2nd       portion of the duodenum. For additional hemostasis, one hemostatic clip       was successfully placed (MR conditional). There was no bleeding at the       end of the procedure. Impression:               - 2 cm hiatal hernia.                           - Normal upper third of esophagus, middle third of                            esophagus and lower third of esophagus.                           - Normal stomach.                           - 11 angioectasias in the duodenum. Treated with                            argon plasma coagulation  (APC). One hemostatic clip                            (MR conditional) was placed.                           - No specimens collected. Recommendation:           - Perform a colonoscopy today. Additional                            recommendations pending colonoscopy findings. Procedure Code(s):        --- Professional ---                           [phone], Esophagogastroduodenoscopy, flexible,                            transoral; with control of bleeding, any method Diagnosis Code(s):        --- Professional ---                           K44.9, Diaphragmatic hernia without obstruction or                            gangrene                           K31.819, Angiodysplasia of stomach and duodenum  without bleeding                           D50.9, Iron deficiency anemia, unspecified                           R19.5, Other fecal abnormalities CPT copyright 2019 American Medical Association. All rights reserved. The codes documented in this report are preliminary and upon coder review may  be revised to meet current compliance requirements. Doristine Locks, MD 11/26/2021 12:20:38 PM Number of Addenda: 0

## 2021-11-26 NOTE — Transfer of Care (Signed)
Immediate Anesthesia Transfer of Care Note  Patient: Marcus Ball  Procedure(s) Performed: ESOPHAGOGASTRODUODENOSCOPY (EGD) WITH PROPOFOL COLONOSCOPY WITH PROPOFOL HOT HEMOSTASIS (ARGON PLASMA COAGULATION/BICAP) HEMOSTASIS CLIP PLACEMENT  Patient Location: PACU  Anesthesia Type:MAC  Level of Consciousness: drowsy, patient cooperative and responds to stimulation  Airway & Oxygen Therapy: Patient Spontanous Breathing  Post-op Assessment: Report given to RN and Post -op Vital signs reviewed and stable  Post vital signs: Reviewed and stable  Last Vitals:  Vitals Value Taken Time  BP    Temp    Pulse    Resp    SpO2      Last Pain:  Vitals:   11/26/21 1030  TempSrc:   PainSc: 0-No pain         Complications: No notable events documented.

## 2021-11-26 NOTE — Anesthesia Postprocedure Evaluation (Signed)
Anesthesia Post Note  Patient: Marcus Ball  Procedure(s) Performed: ESOPHAGOGASTRODUODENOSCOPY (EGD) WITH PROPOFOL COLONOSCOPY WITH PROPOFOL HOT HEMOSTASIS (ARGON PLASMA COAGULATION/BICAP) HEMOSTASIS CLIP PLACEMENT     Patient location during evaluation: PACU Anesthesia Type: MAC Level of consciousness: awake and alert Pain management: pain level controlled Vital Signs Assessment: post-procedure vital signs reviewed and stable Respiratory status: spontaneous breathing, nonlabored ventilation and respiratory function stable Cardiovascular status: blood pressure returned to baseline and stable Postop Assessment: no apparent nausea or vomiting Anesthetic complications: no   No notable events documented.  Last Vitals:  Vitals:   11/26/21 1240 11/26/21 1310  BP: 114/81 119/80  Pulse: 61 60  Resp: 17 12  Temp: 37.1 C   SpO2: 100% 100%    Last Pain:  Vitals:   11/26/21 1240  TempSrc:   PainSc: 0-No pain                 Lynda Rainwater

## 2021-11-26 NOTE — Anesthesia Preprocedure Evaluation (Signed)
Anesthesia Evaluation  Patient identified by MRN, date of birth, ID band Patient awake    Reviewed: Allergy & Precautions, NPO status , Patient's Chart, lab work & pertinent test results  Airway Mallampati: II  TM Distance: >3 FB Neck ROM: Full    Dental no notable dental hx.    Pulmonary neg pulmonary ROS,    Pulmonary exam normal breath sounds clear to auscultation       Cardiovascular negative cardio ROS Normal cardiovascular exam Rhythm:Regular Rate:Normal     Neuro/Psych negative neurological ROS  negative psych ROS   GI/Hepatic negative GI ROS, Neg liver ROS,   Endo/Other  negative endocrine ROS  Renal/GU negative Renal ROS  negative genitourinary   Musculoskeletal negative musculoskeletal ROS (+)   Abdominal   Peds negative pediatric ROS (+)  Hematology  (+) Blood dyscrasia, anemia ,   Anesthesia Other Findings   Reproductive/Obstetrics negative OB ROS                             Anesthesia Physical Anesthesia Plan  ASA: 2  Anesthesia Plan: MAC   Post-op Pain Management: Minimal or no pain anticipated   Induction: Intravenous  PONV Risk Score and Plan: 1 and Ondansetron and Treatment may vary due to age or medical condition  Airway Management Planned: Nasal Cannula  Additional Equipment:   Intra-op Plan:   Post-operative Plan:   Informed Consent: I have reviewed the patients History and Physical, chart, labs and discussed the procedure including the risks, benefits and alternatives for the proposed anesthesia with the patient or authorized representative who has indicated his/her understanding and acceptance.     Dental advisory given  Plan Discussed with: CRNA  Anesthesia Plan Comments:         Anesthesia Quick Evaluation

## 2021-11-26 NOTE — Progress Notes (Signed)
Patients ferric gluconate was due this morning at 10am. Patient did not arrive to [address] until 1356. Ok to skip todays dose per Pokhrel,MD

## 2021-11-26 NOTE — ED Notes (Signed)
Spoke to GI; they will consult patient there.

## 2021-11-26 NOTE — ED Notes (Signed)
Pt and Wife Transported to GI lab.

## 2021-11-26 NOTE — Interval H&P Note (Signed)
History and Physical Interval Note:  No acute events overnight.  Received 1 unit PRBCs with repeat H/H 7.1/24.6 this morning.  No overt bleeding.  Iron studies confirm significant iron deficiency anemia.  11/26/2021 11:08 AM  Marcus Ball  has presented today for surgery, with the diagnosis of FOBT positive iron deficiency anemia.  History of pulmonary and cerebral telangiectasias.  History hereditary hemorrhagic telangiectasia.  The various methods of treatment have been discussed with the patient and family. After consideration of risks, benefits and other options for treatment, the patient has consented to  Procedure(s): ESOPHAGOGASTRODUODENOSCOPY (EGD) WITH PROPOFOL (N/A) COLONOSCOPY WITH PROPOFOL (N/A) as a surgical intervention.  The patient's history has been reviewed, patient examined, no change in status, stable for surgery.  I have reviewed the patient's chart and labs.  Questions were answered to the patient's satisfaction.     Verlin Dike Filipe Greathouse

## 2021-11-26 NOTE — Progress Notes (Addendum)
PROGRESS NOTE    Marcus Ball  YIR:[id number] DOB: 1971/04/25 DOA: 11/25/2021 PCP: Patient, No Pcp Per    Brief Narrative:   Marcus Ball is a 51 y.o. male with a past medical history of hereditary hemorrhagic telangiectasis (HHT), presented to hospital with runny nose little cough and fever at home.  He had tested for COVID at home which was negative.  He went to urgent care center and was noted to be very pale with hemoglobin of 6.8 so was sent to the hospital for further work-up.  Patient has not had endoscopy and colonoscopy in the past.  In the ED rectal exam was done and was noted to be heme positive.  1 unit of packed RBC was ordered, GI was consulted and patient was admitted hospital for further work-up and treatment.     Assessment and Plan:  Principal Problem:   Symptomatic anemia Active Problems:   Hereditary hemorrhagic telangiectasia (HCC)   Iron deficiency anemia   Heme positive stool   Diverticulosis of colon without hemorrhage   Grade II internal hemorrhoids   AVM (arteriovenous malformation) of duodenum, acquired   Symptomatic iron deficiency anemia with Hemoccult stools  Initial iron panel l shows ferritin of 5, iron of 16, TIBC of 598% saturation of 3.  Folate is 22 and vitamin B12 399.  Patient received 1 unit of PRBC.  Hemoglobin today at 7.1 after 1 unit PRBC.  We will transfuse 1 more unit of PRBC.  Might consider iron transfusion tomorrow morning.  Last hemoglobin was 14.1 in October 2019.  Patient underwent EGD and colonoscopy today since he had no evaluation in the past.  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  Patient was also noted to have grade 2 internal hemorrhoids and diverticulosis of the colon.   History of HHT/Osler-Weber-Rendu syndrome.   Patient follows up at [facility].  He does have occasional nasal bleeding.  History of  pulmonary and brain AVM in the past   Upper respiratory tract infection:  Home COVID test was negative.  Not much symptomatic at this time.  Repeat COVID testing pending.  Patient is afebrile with normal CBC.  Patient was given prednisone taper by urgent care will hold for now.    Mildly elevated creatinine likely mild renal insufficiency:  No previous creatinine in the system.  Creatinine improved to 1.1 from 1.3 after IV fluids.   DVT prophylaxis: Place and maintain sequential compression device Start: 11/25/21 1626 SCDs Start: 11/25/21 1609   Code Status:     Code Status: Full Code  Disposition: Home likely 11/27/2021  Status is: Observation The patient will require care spanning > 2 midnights and should be moved to inpatient because: Plan for PRBC transfusion, status post EGD colonoscopy, IV iron on 11/27/2021   Family Communication: None at bedside  Consultants:  GI  Procedures:  EGD and colonoscopy with APC cauterization and clip of multiple small bowel AVMs. PRBC transfusion.  Antimicrobials:  None  Anti-infectives (From admission, onward)    None      Subjective: Today, patient was seen and examined at bedside.  Seen before EGD and colonoscopy.  Denies any nausea vomiting fever chills or rigor.  Objective: Vitals:   11/26/21 1225 11/26/21 1240 11/26/21 1310 11/26/21 1349  BP: 112/71 114/81 119/80 122/73  Pulse: 61 61 60 60  Resp: 17 17 12 17   Temp:  98.8  F (37.1 C)  98.8 F (37.1 C)  TempSrc:      SpO2: 99% 100% 100% 100%  Weight:      Height:        Intake/Output Summary (Last 24 hours) at 11/26/2021 1416 Last data filed at 11/26/2021 1204 Gross per 24 hour  Intake 400 ml  Output --  Net 400 ml   Filed Weights   11/25/21 1440 11/26/21 1030  Weight: 103.1 kg 95.3 kg    Physical Examination: Body mass index is 29.29 kg/m.  General:  Average built, not in obvious distress HENT:   Mild pallor noted.. Oral mucosa is moist.  Chest:  Clear  breath sounds.  Diminished breath sounds bilaterally. No crackles or wheezes.  CVS: S1 &S2 heard. No murmur.  Regular rate and rhythm. Abdomen: Soft, nontender, nondistended.  Bowel sounds are heard.   Extremities: No cyanosis, clubbing or edema.  Peripheral pulses are palpable. Psych: Alert, awake and oriented, normal mood CNS:  No cranial nerve deficits.  Power equal in all extremities.   Skin: Warm and dry.  No rashes noted.  Data Reviewed:   CBC: Recent Labs  Lab 11/25/21 0958 11/25/21 2256 11/26/21 0350  WBC 8.0 9.5 9.4  NEUTROABS 7.8*  --   --   HGB 7.0* 7.3* 7.1*  HCT 26.5* 26.6* 26.6*  MCV 60.9* 63.8* 65.4*  PLT 274 234 198    Basic Metabolic Panel: Recent Labs  Lab 11/25/21 0958 11/26/21 0350  NA 139 141  K 4.5 4.0  CL 106 113*  CO2 23 19*  GLUCOSE 153* 104*  BUN 12 11  CREATININE 1.31* 1.14  CALCIUM 9.5 8.6*    Liver Function Tests: Recent Labs  Lab 11/25/21 0958 11/26/21 0350  AST 25 19  ALT 21 18  ALKPHOS 82 76  BILITOT 0.8 0.8  PROT 7.6 6.6  ALBUMIN 4.2 3.5     Radiology Studies: DG CHEST PORT 1 VIEW  Result Date: 11/25/2021 CLINICAL DATA:  10031; weakness, cough and congestion for 5 days EXAM: PORTABLE CHEST 1 VIEW COMPARISON:  February 18, 2018 FINDINGS: The heart size and mediastinal contours are stable. Both lungs are clear. The visualized skeletal structures are unremarkable. IMPRESSION: No active disease. Electronically Signed   By: Marjo Bicker M.D.   On: 11/25/2021 16:38      LOS: 0 days    Joycelyn Das, MD [facility] Available via Epic secure chat 7am-7pm After these hours, please refer to coverage provider listed on [email or web address] 11/26/2021, 2:16 PM

## 2021-11-26 NOTE — Progress Notes (Signed)
Pt arrived to 6 north room 26 alert and oriented x4. Pain level 0/10. Patient ambulated to bathroom independently with no issues and urinated upon arrival. Bed in lowest position. Call light in reach. Lunch tray ordered. Wife at bedside. Will continue to monitor patient.

## 2021-11-27 ENCOUNTER — Encounter (HOSPITAL_COMMUNITY): Payer: Self-pay | Admitting: Gastroenterology

## 2021-11-27 ENCOUNTER — Telehealth: Payer: Self-pay | Admitting: Pharmacy Technician

## 2021-11-27 ENCOUNTER — Telehealth: Payer: Self-pay

## 2021-11-27 ENCOUNTER — Other Ambulatory Visit: Payer: Self-pay

## 2021-11-27 DIAGNOSIS — D500 Iron deficiency anemia secondary to blood loss (chronic): Secondary | ICD-10-CM | POA: Diagnosis not present

## 2021-11-27 DIAGNOSIS — K31819 Angiodysplasia of stomach and duodenum without bleeding: Secondary | ICD-10-CM | POA: Diagnosis not present

## 2021-11-27 DIAGNOSIS — D649 Anemia, unspecified: Secondary | ICD-10-CM | POA: Diagnosis not present

## 2021-11-27 DIAGNOSIS — D509 Iron deficiency anemia, unspecified: Secondary | ICD-10-CM

## 2021-11-27 DIAGNOSIS — R195 Other fecal abnormalities: Secondary | ICD-10-CM

## 2021-11-27 DIAGNOSIS — K5730 Diverticulosis of large intestine without perforation or abscess without bleeding: Secondary | ICD-10-CM | POA: Diagnosis not present

## 2021-11-27 LAB — BPAM RBC
Blood Product Expiration Date: 202307182359
Blood Product Expiration Date: 202307272359
ISSUE DATE / TIME: 202307111713
ISSUE DATE / TIME: 202307121526
Unit Type and Rh: 6200
Unit Type and Rh: 6200

## 2021-11-27 LAB — TYPE AND SCREEN
ABO/RH(D): A POS
Antibody Screen: NEGATIVE
Unit division: 0
Unit division: 0

## 2021-11-27 LAB — CBC
HCT: 27.6 % — ABNORMAL LOW (ref 39.0–52.0)
Hemoglobin: 8.1 g/dL — ABNORMAL LOW (ref 13.0–17.0)
MCH: 18.8 pg — ABNORMAL LOW (ref 26.0–34.0)
MCHC: 29.3 g/dL — ABNORMAL LOW (ref 30.0–36.0)
MCV: 63.9 fL — ABNORMAL LOW (ref 80.0–100.0)
Platelets: 235 10*3/uL (ref 150–400)
RBC: 4.32 MIL/uL (ref 4.22–5.81)
RDW: 25.6 % — ABNORMAL HIGH (ref 11.5–15.5)
WBC: 9.8 10*3/uL (ref 4.0–10.5)
nRBC: 0 % (ref 0.0–0.2)

## 2021-11-27 LAB — BASIC METABOLIC PANEL
Anion gap: 9 (ref 5–15)
BUN: 12 mg/dL (ref 6–20)
CO2: 23 mmol/L (ref 22–32)
Calcium: 8.8 mg/dL — ABNORMAL LOW (ref 8.9–10.3)
Chloride: 109 mmol/L (ref 98–111)
Creatinine, Ser: 1.1 mg/dL (ref 0.61–1.24)
GFR, Estimated: >60 mL/min (ref >60)
Glucose, Bld: 91 mg/dL (ref 70–99)
Potassium: 4.1 mmol/L (ref 3.5–5.1)
Sodium: 141 mmol/L (ref 135–145)

## 2021-11-27 LAB — MAGNESIUM: Magnesium: 1.9 mg/dL (ref 1.7–2.4)

## 2021-11-27 MED ORDER — SODIUM CHLORIDE 0.9 % IV SOLN: 510.0000 mg | Freq: Once | INTRAVENOUS | Status: DC

## 2021-11-27 MED ORDER — GUAIFENESIN ER 600 MG PO TB12: 600.0000 mg | ORAL_TABLET | Freq: Two times a day (BID) | ORAL | Status: DC

## 2021-11-27 MED ORDER — SODIUM CHLORIDE 0.9 % IV SOLN
250.0000 mg | Freq: Once | INTRAVENOUS | Status: AC
  Administered 2021-11-27: 250 mg via INTRAVENOUS
  Filled 2021-11-27 (×2): qty 20

## 2021-11-27 MED ORDER — FERROUS SULFATE 325 (65 FE) MG PO TABS: 325.0000 mg | ORAL_TABLET | Freq: Every day | ORAL | Status: DC

## 2021-11-27 MED ORDER — PANTOPRAZOLE SODIUM 40 MG PO TBEC: 40.0000 mg | DELAYED_RELEASE_TABLET | Freq: Every day | ORAL | 0 refills | Status: DC

## 2021-11-27 MED ORDER — DM-GUAIFENESIN ER 30-600 MG PO TB12: 1.0000 | ORAL_TABLET | Freq: Two times a day (BID) | ORAL | 0 refills | Status: AC

## 2021-11-27 MED ORDER — DM-GUAIFENESIN ER 30-600 MG PO TB12
1.0000 | ORAL_TABLET | Freq: Two times a day (BID) | ORAL | Status: DC
  Administered 2021-11-27: 1 via ORAL
  Filled 2021-11-27: qty 1

## 2021-11-27 MED ORDER — FERROUS SULFATE 325 (65 FE) MG PO TABS: ORAL_TABLET | ORAL | 0 refills | Status: AC

## 2021-11-27 NOTE — Progress Notes (Addendum)
Attending physician's note   I have taken a history, reviewed the chart, and examined the patient. I performed a substantive portion of this encounter, including complete performance of at least one of the key components, in conjunction with the APP. I agree with the APP's note, impression, and recommendations with my edits.   Alahia Whicker, DO, FACG [phone] office          Progress Note  Primary GI: Dr. Barron Alvine   Subjective  Chief Complaint: Anemia  No further bowel movements since colonoscopy prep yesterday. Patient getting IV iron currently. Denies any nausea, vomiting, abdominal pain.    Objective   Vital signs in last 24 hours: Temp:  [98 F (36.7 C)-98.8 F (37.1 C)] 98 F (36.7 C) (07/13 0800) Pulse Rate:  [57-67] 63 (07/13 0800) Resp:  [12-18] 17 (07/13 0800) BP: (92-129)/(62-81) 92/64 (07/13 0800) SpO2:  [98 %-100 %] 100 % (07/13 0800)   Last BM recorded by nurses in past 5 days No data recorded  General:   male in no acute distress  Heart:  Regular rate and rhythm; no murmurs Pulm: Clear anteriorly; no wheezing Abdomen:  Soft, Non-distended AB, Active bowel sounds. No tenderness . Without guarding and Without rebound, No organomegaly appreciated. Extremities:  without  edema. Neurologic:  Alert and  oriented x4;  No focal deficits.  Psych:  Cooperative. Normal mood and affect.  Intake/Output from previous day: 07/12 0701 - 07/13 0700 In: 715 [I.V.:400; Blood:315] Out: -  Intake/Output this shift: No intake/output data recorded.  Studies/Results: DG CHEST PORT 1 VIEW  Result Date: 11/25/2021 CLINICAL DATA:  10031; weakness, cough and congestion for 5 days EXAM: PORTABLE CHEST 1 VIEW COMPARISON:  February 18, 2018 FINDINGS: The heart size and mediastinal contours are stable. Both lungs are clear. The visualized skeletal structures are unremarkable. IMPRESSION: No active disease. Electronically Signed   By: Marjo Bicker M.D.   On:  11/25/2021 16:38    Lab Results: Recent Labs    11/25/21 2256 11/26/21 0350 11/27/21 0027  WBC 9.5 9.4 9.8  HGB 7.3* 7.1* 8.1*  HCT 26.6* 26.6* 27.6*  PLT 234 198 235   BMET Recent Labs    11/25/21 0958 11/26/21 0350 11/27/21 0027  NA 139 141 141  K 4.5 4.0 4.1  CL 106 113* 109  CO2 23 19* 23  GLUCOSE 153* 104* 91  BUN 12 11 12   CREATININE 1.31* 1.14 1.10  CALCIUM 9.5 8.6* 8.8*   LFT Recent Labs    11/26/21 0350  PROT 6.6  ALBUMIN 3.5  AST 19  ALT 18  ALKPHOS 76  BILITOT 0.8   PT/INR No results for input(s): "LABPROT", "INR" in the last 72 hours.    Patient profile:   51 year old male history of HHT, known pulmonary and brain AVMs requiring surgery presents with symptomatic anemia FOBT positive in the ER   Impression/Plan:   IDA CBC on 11/27/2021   WBC 9.8 HGB 8.1 MCV 63.9 Platelets 235 (admitting hemoglobin 6.8) Anemia studies on 11/25/2021  Iron 16 Ferritin 5 B12 399 11/26/2021 colonoscopy and endoscopy for IDA and heavy positive stools with known history of brain and pulmonary AVMs Colonoscopy showed diverticula sigmoid colon, nonbleeding internal hemorrhoids grade 2 Endoscopy 2 cm hiatal hernia normal esophagus stomach, 11 small AVMs within the duodenum hemostasis with APC and hemostatic clip successfully placed Hemoglobin increased today 8.1 from 7.1, patient had 2 units packed red blood cells Patient receiving iron infusion Likely source of  GI bleed was small bowel AVMs status post APC and clip yesterday If continuing to be transfusion dependent anemia continues to have decrease in hemoglobin, will need to have capsule endoscopy and/or possible enteroscopy Continue monitor H&H.  Set up for follow-up outpatient for lab only to recheck CBC. Plan to set up at iron infusions outpatient   LOS: 0 days   Doree Albee  11/27/2021, 10:32 AM

## 2021-11-27 NOTE — Telephone Encounter (Signed)
Auth Submission: no auth needed Payer: [employer]BS Medication & CPT/J Code(s) submitted: FERRLECIT Route of submission (phone, fax, portal): PHONE Auth type: Buy/Bill Units/visits requested: X4 DOSES Reference number: [id number] 11/27/21 4:19p Approval from: 11/27/21 to 03/30/22   Patient will be scheduled as soon as possible.

## 2021-11-27 NOTE — Addendum Note (Signed)
Addended by: Garen Lah A on: 11/27/2021 12:21 PM   Modules accepted: Orders

## 2021-11-27 NOTE — Telephone Encounter (Signed)
Orders placed for CBC and iron infusion. Reminder placed for iron panel in 2 months. F/u scheduled with Dr. Barron Alvine on 12/30/21 at 9 am. Left message for pt to call back.

## 2021-11-27 NOTE — Progress Notes (Signed)
Patient discharged to home with wife via wheelchair with all belongings( wedding ring).

## 2021-11-27 NOTE — Progress Notes (Signed)
Discharge instructions given to pt. Pt verbalized understanding of all teaching and had no further questions. 

## 2021-11-27 NOTE — Telephone Encounter (Signed)
-----   Message from [facility] V, DO sent at 11/27/2021 11:38 AM EDT ----- Probably will only need 2 additional doses since he also received pRBCs. But otherwise yes, ok to set up for next week. Thanks!  ----- Message ----- From: Orion Modest, RN Sent: 11/27/2021  10:16 AM EDT To: Shellia Cleverly, DO  The therapy plan for Ferrlecit is once a week for 4 weeks so is it okay to order 3 doses total with the second dose occurring 1 week from today?  ----- Message ----- From: Shellia Cleverly, DO Sent: 11/26/2021  12:34 PM EDT To: Orion Modest, RN  I anticipate this patient will be discharged either later today or tomorrow.  Please coordinate the following:  - Repeat CBC 7-10 days after hospital discharge - Repeat iron panel 2 months after discharge - Coordinate for dose #2 of IV iron in about 2-3 weeks as outpatient - Schedule follow-up appointment with me in 4-6 weeks (or one of the APP's)

## 2021-12-01 NOTE — Discharge Summary (Signed)
Physician Discharge Summary   Patient: Marcus Ball MRN: [id number] DOB: [birth year]  Admit date:     11/25/2021  Discharge date: 11/27/2021  Discharge Physician: Lynden Oxford  PCP: Patient, No Pcp Per  Recommendations at discharge: Follow-up with PCP in 1 week Outpatient referral to hematology recommended, patient would prefer referral by his [facility] clinic  Discharge Diagnoses: Principal Problem:   Symptomatic anemia Active Problems:   Hereditary hemorrhagic telangiectasia (HCC)   Iron deficiency anemia   Heme positive stool   Diverticulosis of colon without hemorrhage   Grade II internal hemorrhoids   AVM (arteriovenous malformation) of duodenum, acquired  Assessment and Plan: Symptomatic acute on chronic iron deficiency anemia likely due to chronic GI bleeding from AVM Initial iron panel shows ferritin of 5, iron of 16, TIBC of 598% saturation of 3.  Folate is 22 and vitamin B12 399.   Patient received 2 unit of PRBC.  Last hemoglobin was 14.1 in October 2019.   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   Patient was also noted to have grade 2 internal hemorrhoids and diverticulosis of the colon. Patient was treated with IV iron infusion as well. I suspect that the patient will continue to have minor degree of chronic GI blood loss and therefore will benefit from frequent IV iron infusion. Recommend patient to follow-up with PCP to have a referral to hematology set up for IV iron infusion.   History of HHT/Osler-Weber-Rendu syndrome.   Patient [facility] up at [facility].  He does have occasional nasal bleeding.  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  Not much symptomatic at this time. Patient is afebrile with normal CBC.  Patient was given prednisone taper by urgent care will  hold for now.     Mildly elevated creatinine likely mild renal insufficiency:  No previous creatinine in the system.  Creatinine improved to 1.1 from 1.3 after IV fluids.  Consultants: GI Procedures performed:  EGD DISCHARGE MEDICATION: Allergies as of 11/27/2021       Reactions   Other Other (See Comments)   **Cannot take blood thinners(HHT) - causes anemia   Penicillins Hives   Hives in HS, but takes Amoxicillin before dental procedures without reaction   Sulfa Antibiotics Hives        Medication List     STOP taking these medications    azithromycin 250 MG tablet Commonly known as: ZITHROMAX   benzonatate 100 MG capsule Commonly known as: TESSALON   doxycycline 100 MG tablet Commonly known as: VIBRA-TABS   naproxen 500 MG tablet Commonly known as: NAPROSYN       TAKE these medications    allopurinol 300 MG tablet Commonly known as: ZYLOPRIM Take 1 tablet (300 mg total) by mouth daily.   azelastine 0.1 % nasal spray Commonly known as: ASTELIN Place 2 sprays into both nostrils 2 (two) times daily. Use in each nostril as directed What changed: additional instructions   dextromethorphan-guaiFENesin 30-600 MG 12hr tablet Commonly known as: MUCINEX DM Take 1 tablet by mouth 2 (two) times daily for 14 days.   ferrous sulfate 325 (65 FE) MG tablet Take 1 tablet (325 mg total) by mouth 2 (two) times daily with a meal for 30 days, THEN 1 tablet (325 mg total) daily with breakfast. Start taking on: November 27, 2021   pantoprazole 40 MG tablet Commonly known as: PROTONIX Take 1 tablet (40 mg total) by mouth daily for 14 days.        Follow-up Information     PCP. Schedule an appointment as soon as possible for a visit in 2 week(s).   Why: establish care and check repeat CBC.  Need a hematology referral as well for iron deficiancy anemia and potential need for iron infusions.               Disposition: Home Diet recommendation: Regular  diet  Discharge Exam: Vitals:   11/26/21 1832 11/26/21 2114 11/27/21 0351 11/27/21 0800  BP: 118/75 120/72 117/74 92/64  Pulse: 60 67 (!) 57 63  Resp: 18 16  17   Temp: 98.4 F (36.9 C) 98.2 F (36.8 C) 98.2 F (36.8 C) 98 F (36.7 C)  TempSrc: Oral Oral Oral Oral  SpO2: 100% 100% 98% 100%  Weight:      Height:       General: Appear in no distress; no visible Abnormal Neck Mass Or lumps, Conjunctiva normal Cardiovascular: S1 and S2 Present, no Murmur, Respiratory: good respiratory effort, Bilateral Air entry present and CTA, no Crackles, no wheezes Abdomen: Bowel Sound present, Non tender  Extremities: no Pedal edema Neurology: alert and oriented to time, place, and person  Gait not checked due to patient safety concerns Filed Weights   11/25/21 1440 11/26/21 1030  Weight: 103.1 kg 95.3 kg   Condition at discharge: stable  The results of significant diagnostics from this hospitalization (including imaging, microbiology, ancillary and laboratory) are listed below for reference.   Imaging Studies: DG CHEST PORT 1 VIEW  Result Date: 11/25/2021 CLINICAL DATA:  10031; weakness, cough and congestion for 5 days EXAM: PORTABLE CHEST 1 VIEW COMPARISON:  February 18, 2018 FINDINGS: The heart size and mediastinal contours are stable. Both lungs are clear. The visualized skeletal structures are unremarkable. IMPRESSION: No active disease. Electronically Signed   By: February 20, 2018 M.D.   On: 11/25/2021 16:38    Microbiology: Results for orders placed or performed in visit on 05/23/15  Culture, Group A Strep     Status: None   Collection Time: 05/23/15  9:11 AM   Specimen: Throat  Result Value Ref Range Status   Organism ID, Bacteria Normal Upper Respiratory Flora  Final   Organism ID, Bacteria No Beta Hemolytic Streptococci Isolated  Final    Labs: CBC: Recent Labs  Lab 11/25/21 0958 11/25/21 2256 11/26/21 0350 11/27/21 0027  WBC 8.0 9.5 9.4 9.8  NEUTROABS 7.8*  --   --   --    HGB 7.0* 7.3* 7.1* 8.1*  HCT 26.5* 26.6* 26.6* 27.6*  MCV 60.9* 63.8* 65.4* 63.9*  PLT 274 234 198 235   Basic Metabolic Panel: Recent Labs  Lab 11/25/21 0958 11/26/21 0350 11/27/21 0027  NA 139 141 141  K 4.5 4.0 4.1  CL 106 113* 109  CO2 23 19* 23  GLUCOSE 153* 104* 91  BUN 12 11 12   CREATININE 1.31* 1.14 1.10  CALCIUM 9.5 8.6* 8.8*  MG  --   --  1.9   Liver Function Tests: Recent Labs  Lab 11/25/21 0958 11/26/21 0350  AST 25 19  ALT 21 18  ALKPHOS 82 76  BILITOT 0.8 0.8  PROT 7.6 6.6  ALBUMIN 4.2 3.5   CBG: No results for input(s): "GLUCAP" in the last 168 hours.  Discharge time spent: greater than 30 minutes.  Signed: 01/26/22  Allena Katz, MD [facility] 11/27/2021

## 2021-12-01 NOTE — Telephone Encounter (Signed)
Spoke with pt to let him know about f/u appointment and CBC that is due 7-10 days after discharge. Pt verbalized understanding and stated he would stop by lab on 7/21 for CBC.

## 2021-12-05 ENCOUNTER — Other Ambulatory Visit (INDEPENDENT_AMBULATORY_CARE_PROVIDER_SITE_OTHER): Payer: BC Managed Care – PPO

## 2021-12-05 ENCOUNTER — Ambulatory Visit (INDEPENDENT_AMBULATORY_CARE_PROVIDER_SITE_OTHER): Payer: BC Managed Care – PPO

## 2021-12-05 VITALS — BP 120/78 | HR 61 | Temp 97.8°F | Resp 16 | Ht 72.0 in | Wt 210.0 lb

## 2021-12-05 DIAGNOSIS — D509 Iron deficiency anemia, unspecified: Secondary | ICD-10-CM

## 2021-12-05 DIAGNOSIS — R195 Other fecal abnormalities: Secondary | ICD-10-CM

## 2021-12-05 LAB — CBC WITH DIFFERENTIAL/PLATELET
Basophils Absolute: 0.1 10*3/uL (ref 0.0–0.1)
Basophils Relative: 1 % (ref 0.0–3.0)
Eosinophils Absolute: 0.2 10*3/uL (ref 0.0–0.7)
Eosinophils Relative: 3.8 % (ref 0.0–5.0)
HCT: 29.2 % — ABNORMAL LOW (ref 39.0–52.0)
Hemoglobin: 9.1 g/dL — ABNORMAL LOW (ref 13.0–17.0)
Lymphocytes Relative: 20.8 % (ref 12.0–46.0)
Lymphs Abs: 1.3 10*3/uL (ref 0.7–4.0)
MCHC: 31.2 g/dL (ref 30.0–36.0)
MCV: 65.7 fl — ABNORMAL LOW (ref 78.0–100.0)
Monocytes Absolute: 0.7 10*3/uL (ref 0.1–1.0)
Monocytes Relative: 11.1 % (ref 3.0–12.0)
Neutro Abs: 4 10*3/uL (ref 1.4–7.7)
Neutrophils Relative %: 63.3 % (ref 43.0–77.0)
Platelets: 218 10*3/uL (ref 150.0–400.0)
RBC: 4.45 Mil/uL (ref 4.22–5.81)
RDW: 35 % — ABNORMAL HIGH (ref 11.5–15.5)
WBC: 6.3 10*3/uL (ref 4.0–10.5)

## 2021-12-05 MED ORDER — SODIUM CHLORIDE 0.9 % IV SOLN
250.0000 mg | Freq: Once | INTRAVENOUS | Status: AC
  Administered 2021-12-05: 250 mg via INTRAVENOUS
  Filled 2021-12-05: qty 20

## 2021-12-05 NOTE — Progress Notes (Signed)
Diagnosis: Iron Deficiency Anemia  Provider:  Chilton Greathouse, MD  Procedure: Infusion  IV Type: Peripheral, IV Location: L Antecubital  Ferrlecit, Dose: 250 mg  Infusion Start Time: 1244  Infusion Stop Time: 1259  Post Infusion IV Care: Peripheral IV Discontinued  Discharge: Condition: Good, Destination: Home . AVS provided to patient.   Performed by:  Loney Hering, LPN

## 2021-12-12 ENCOUNTER — Ambulatory Visit (INDEPENDENT_AMBULATORY_CARE_PROVIDER_SITE_OTHER): Payer: BC Managed Care – PPO

## 2021-12-12 VITALS — BP 120/70 | HR 58 | Temp 98.1°F | Resp 16 | Ht 72.0 in | Wt 212.4 lb

## 2021-12-12 DIAGNOSIS — R195 Other fecal abnormalities: Secondary | ICD-10-CM | POA: Diagnosis not present

## 2021-12-12 DIAGNOSIS — D509 Iron deficiency anemia, unspecified: Secondary | ICD-10-CM | POA: Diagnosis not present

## 2021-12-12 MED ORDER — SODIUM CHLORIDE 0.9 % IV SOLN
250.0000 mg | Freq: Once | INTRAVENOUS | Status: AC
  Administered 2021-12-12: 250 mg via INTRAVENOUS
  Filled 2021-12-12: qty 20

## 2021-12-12 NOTE — Progress Notes (Signed)
Diagnosis: Iron Deficiency Anemia  Provider:  Chilton Greathouse, MD  Procedure: Infusion  IV Type: Peripheral, IV Location: L Hand  Ferrlecit, Dose: 250 mg  Infusion Start Time: 1042  Infusion Stop Time: 1255  Post Infusion IV Care: Peripheral IV Discontinued  Discharge: Condition: Good, Destination: Home . AVS provided to patient.   Performed by:  Loney Hering, LPN

## 2021-12-19 ENCOUNTER — Ambulatory Visit (INDEPENDENT_AMBULATORY_CARE_PROVIDER_SITE_OTHER): Payer: BC Managed Care – PPO

## 2021-12-19 VITALS — BP 125/81 | HR 60 | Temp 98.3°F | Resp 16 | Ht 72.0 in | Wt 212.2 lb

## 2021-12-19 DIAGNOSIS — K922 Gastrointestinal hemorrhage, unspecified: Secondary | ICD-10-CM | POA: Diagnosis not present

## 2021-12-19 DIAGNOSIS — D509 Iron deficiency anemia, unspecified: Secondary | ICD-10-CM

## 2021-12-19 DIAGNOSIS — R195 Other fecal abnormalities: Secondary | ICD-10-CM

## 2021-12-19 DIAGNOSIS — D500 Iron deficiency anemia secondary to blood loss (chronic): Secondary | ICD-10-CM

## 2021-12-19 MED ORDER — ALBUTEROL SULFATE HFA 108 (90 BASE) MCG/ACT IN AERS: 2.0000 | INHALATION_SPRAY | Freq: Once | RESPIRATORY_TRACT | Status: DC | PRN

## 2021-12-19 MED ORDER — HEPARIN SOD (PORK) LOCK FLUSH 100 UNIT/ML IV SOLN: 500.0000 [IU] | Freq: Once | INTRAVENOUS | Status: DC | PRN

## 2021-12-19 MED ORDER — SODIUM CHLORIDE 0.9 % IV SOLN
250.0000 mg | Freq: Once | INTRAVENOUS | Status: AC
  Administered 2021-12-19: 250 mg via INTRAVENOUS
  Filled 2021-12-19: qty 20

## 2021-12-19 MED ORDER — METHYLPREDNISOLONE SODIUM SUCC 125 MG IJ SOLR: 125.0000 mg | Freq: Once | INTRAMUSCULAR | Status: DC | PRN

## 2021-12-19 MED ORDER — ALTEPLASE 2 MG IJ SOLR: 2.0000 mg | Freq: Once | INTRAMUSCULAR | Status: DC | PRN

## 2021-12-19 MED ORDER — ANTICOAGULANT SODIUM CITRATE 4% (200MG/5ML) IV SOLN
5.0000 mL | Freq: Once | Status: DC | PRN
  Filled 2021-12-19: qty 5

## 2021-12-19 MED ORDER — SODIUM CHLORIDE 0.9 % IV SOLN: Freq: Once | INTRAVENOUS | Status: DC | PRN

## 2021-12-19 MED ORDER — SODIUM CHLORIDE 0.9% FLUSH: 10.0000 mL | Freq: Once | INTRAVENOUS | Status: DC | PRN

## 2021-12-19 MED ORDER — HEPARIN SOD (PORK) LOCK FLUSH 100 UNIT/ML IV SOLN: 250.0000 [IU] | Freq: Once | INTRAVENOUS | Status: DC | PRN

## 2021-12-19 MED ORDER — EPINEPHRINE 0.3 MG/0.3ML IJ SOAJ: 0.3000 mg | Freq: Once | INTRAMUSCULAR | Status: DC | PRN

## 2021-12-19 MED ORDER — SODIUM CHLORIDE 0.9% FLUSH: 3.0000 mL | Freq: Once | INTRAVENOUS | Status: DC | PRN

## 2021-12-19 MED ORDER — DIPHENHYDRAMINE HCL 50 MG/ML IJ SOLN: 50.0000 mg | Freq: Once | INTRAMUSCULAR | Status: DC | PRN

## 2021-12-19 MED ORDER — FAMOTIDINE IN NACL 20-0.9 MG/50ML-% IV SOLN: 20.0000 mg | Freq: Once | INTRAVENOUS | Status: DC | PRN

## 2021-12-19 NOTE — Progress Notes (Signed)
Diagnosis: Iron Deficiency Anemia  Provider:  Chilton Greathouse, MD  Procedure: Infusion  IV Type: Peripheral, IV Location: L Forearm  Ferrlecit, Dose: 250mg   Infusion Start Time: 1130  Infusion Stop Time: 1152  Post Infusion IV Care: Peripheral IV Discontinued  Discharge: Condition: Good, Destination: Home . AVS provided to patient.   Performed by:  , LPN

## 2021-12-26 ENCOUNTER — Ambulatory Visit: Payer: BC Managed Care – PPO

## 2021-12-30 ENCOUNTER — Ambulatory Visit (INDEPENDENT_AMBULATORY_CARE_PROVIDER_SITE_OTHER): Payer: BC Managed Care – PPO | Admitting: Gastroenterology

## 2021-12-30 ENCOUNTER — Other Ambulatory Visit (INDEPENDENT_AMBULATORY_CARE_PROVIDER_SITE_OTHER): Payer: BC Managed Care – PPO

## 2021-12-30 ENCOUNTER — Encounter: Payer: Self-pay | Admitting: Gastroenterology

## 2021-12-30 VITALS — BP 122/80 | HR 64 | Ht 72.0 in | Wt 212.0 lb

## 2021-12-30 DIAGNOSIS — K5520 Angiodysplasia of colon without hemorrhage: Secondary | ICD-10-CM | POA: Diagnosis not present

## 2021-12-30 DIAGNOSIS — I780 Hereditary hemorrhagic telangiectasia: Secondary | ICD-10-CM | POA: Diagnosis not present

## 2021-12-30 DIAGNOSIS — D509 Iron deficiency anemia, unspecified: Secondary | ICD-10-CM

## 2021-12-30 LAB — CBC WITH DIFFERENTIAL/PLATELET
Basophils Absolute: 0.1 10*3/uL (ref 0.0–0.1)
Basophils Relative: 0.6 % (ref 0.0–3.0)
Eosinophils Absolute: 0.2 10*3/uL (ref 0.0–0.7)
Eosinophils Relative: 2.6 % (ref 0.0–5.0)
HCT: 37.6 % — ABNORMAL LOW (ref 39.0–52.0)
Hemoglobin: 12 g/dL — ABNORMAL LOW (ref 13.0–17.0)
Lymphocytes Relative: 11.9 % — ABNORMAL LOW (ref 12.0–46.0)
Lymphs Abs: 1.1 10*3/uL (ref 0.7–4.0)
MCHC: 31.8 g/dL (ref 30.0–36.0)
MCV: 77.9 fl — ABNORMAL LOW (ref 78.0–100.0)
Monocytes Absolute: 0.6 10*3/uL (ref 0.1–1.0)
Monocytes Relative: 6 % (ref 3.0–12.0)
Neutro Abs: 7.4 10*3/uL (ref 1.4–7.7)
Neutrophils Relative %: 78.9 % — ABNORMAL HIGH (ref 43.0–77.0)
Platelets: 130 10*3/uL — ABNORMAL LOW (ref 150.0–400.0)
RBC: 4.83 Mil/uL (ref 4.22–5.81)
RDW: 33.2 % — ABNORMAL HIGH (ref 11.5–15.5)
WBC: 9.4 10*3/uL (ref 4.0–10.5)

## 2021-12-30 NOTE — Patient Instructions (Signed)
If you are age 51 or younger, your body mass index should be between 19-25. Your Body mass index is 28.75 kg/m. If this is out of the aformentioned range listed, please consider follow up with your Primary Care Provider.  ________________________________________________________  The [facility] providers would like to encourage you to use [facility] to communicate with providers for non-urgent requests or questions.  Due to long hold times on the telephone, sending your provider a message by [facility] may be a faster and more efficient way to get a response.  Please allow 48 business hours for a response.  Please remember that this is for non-urgent requests.  _______________________________________________________  Your provider has requested that you go to the basement level for lab work before leaving today. Press "B" on the elevator. The lab is located at the first door on the left as you exit the elevator. You will need to go by the lab for follow up lab work after September 19. You can stop by anytime between 8:00 am and 5:00 pm with no appointment.  Contact the office at the end of the month or the beginning of the next month to scheduled a 3 month follow.  Thank you for entrusting me with your care and choosing [facility].  Dr. Barron Alvine

## 2021-12-30 NOTE — Progress Notes (Signed)
Chief Complaint:    Osler-Weber-Rendu syndrome, iron deficiency anemia  GI History: 51 year old male with a history of HHT c/b Brain and pulmonary AVMs requiring prior surgery (craniotomy 2013, lung surgery at [facility] but unsure when).  Follows at [facility] HHT clinic.  Hospital admission in 11/2021 with symptomatic iron deficiency anemia without overt bleeding, diagnosed with duodenal AVMs treated with APC (Osler-Weber-Rendu syndrome).    -7/11-13, 2023: Hospital admission for symptomatic IDA without overt bleeding: H/H 7/26.5, MCV/RDW 61/21.  Ferritin 5, iron 16, TIBC 598, sat 3%.  Normal B12/folate.  FOBT positive.  Transfuse 2 unit PRBCs and given IV iron.  H/H 8.1/27.6 at discharge -11/26/2021: EGD (inpatient): 2 cm HH, 11 AVMs in duodenum treated with APC, and 1 clip placed at site of mild oozing - 11/26/2021: Colonoscopy (inpatient): Sigmoid diverticulosis, grade 2 internal hemorrhoids.  Otherwise normal colon, normal TI. - 12/05/2021: H/H 9.1/29.2, MCV/RDW 65.7/35   HPI:     Patient is a 51 y.o. male presenting to the Gastroenterology Clinic for hospital follow-up.  Recent hospital course as outlined above.  Since hospital discharge, has received 3 more doses of IV iron.  Has follow-up appointment scheduled at [facility] Hematology/HHT clinic on 10/12, to include CT chest that day.  States he still has some fatigue, but overall better.  Ice pica has resolved.  Has had intermittent "stomach ache". Not frank pain. Decreased stool frequency, which is a change from baseline 2 BM/day.  Stools are darker, but not melenic.  Has been taking PO iron daily. Good PO intake.   Review of systems:     No chest pain, no SOB, no fevers, no urinary sx   Past Medical History:  Diagnosis Date   HHT (hereditary hemorrhagic telangiectasia) (HCC)     Patient's surgical history, family medical history, social history, medications and allergies were all reviewed in Epic    Current Outpatient Medications  Medication Sig  Dispense Refill   allopurinol (ZYLOPRIM) 300 MG tablet Take 1 tablet (300 mg total) by mouth daily. 90 tablet 0   azelastine (ASTELIN) 0.1 % nasal spray Place 2 sprays into both nostrils 2 (two) times daily. Use in each nostril as directed (Patient taking differently: Place 2 sprays into both nostrils 2 (two) times daily.) 30 mL 0   ferrous sulfate 325 (65 FE) MG tablet Take 1 tablet (325 mg total) by mouth 2 (two) times daily with a meal for 30 days, THEN 1 tablet (325 mg total) daily with breakfast. 90 tablet 0   No current facility-administered medications for this visit.    Physical Exam:     BP 122/80   Pulse 64   Ht 6' (1.829 m)   Wt 212 lb (96.2 kg)   BMI 28.75 kg/m   GENERAL:  Pleasant male in NAD PSYCH: Cooperative, normal affect Musculoskeletal:  Normal muscle tone, normal strength NEURO: Alert and oriented x 3, no focal neurologic deficits   IMPRESSION and PLAN:    1) Osler-Weber-Rendu syndrome 2) Iron deficiency anemia 3) Hereditary hemorrhagic telangiectasias 4) Duodenal AVMs - CBC check today - Provided CBC uptrending, plan to continue current therapy with repeat CBC along with iron check in 4-6 weeks - Take p.o. iron QOD to try to limit some of the GI symptoms - If no appreciable improvement in H/H on repeat CBC today, plan for Video Capsule Endoscopy for further small bowel interrogation.  If uptrending, can hold off on VCE for the time being - Follow[facility]up as scheduled at [facility]ology[facility]  RTC in 3 months or sooner prn  I spent 30 minutes of time, including in depth chart review, independent review of results as outlined above, communicating results with the patient directly, face-to-face time with the patient, coordinating care, and ordering studies and medications as appropriate, and documentation.         Shellia Cleverly ,DO, FACG 12/30/2021, 9:13 AM

## 2022-02-12 ENCOUNTER — Telehealth: Payer: Self-pay

## 2022-02-12 NOTE — Telephone Encounter (Signed)
Let pt know about repeat labs. Pt verbalized understanding and had no other concerns at end of call.

## 2022-02-12 NOTE — Telephone Encounter (Signed)
-----   Message from Marice Potter, RN sent at 01/28/2022  9:09 AM EDT ----- Pt needs repeat CBC and iron panel.  Orders in epic.

## 2022-07-21 ENCOUNTER — Other Ambulatory Visit (INDEPENDENT_AMBULATORY_CARE_PROVIDER_SITE_OTHER): Payer: Self-pay | Admitting: Orthopaedic Surgery

## 2022-10-19 ENCOUNTER — Other Ambulatory Visit (INDEPENDENT_AMBULATORY_CARE_PROVIDER_SITE_OTHER): Payer: Self-pay | Admitting: Orthopaedic Surgery

## 2022-10-19 NOTE — Telephone Encounter (Signed)
This was written for 12 months refill

## 2023-07-27 ENCOUNTER — Encounter: Payer: Self-pay | Admitting: Orthopaedic Surgery

## 2023-07-27 ENCOUNTER — Other Ambulatory Visit (INDEPENDENT_AMBULATORY_CARE_PROVIDER_SITE_OTHER): Payer: Self-pay

## 2023-07-27 ENCOUNTER — Ambulatory Visit: Admitting: Orthopaedic Surgery

## 2023-07-27 VITALS — BP 126/85 | HR 72 | Ht 72.0 in | Wt 210.0 lb

## 2023-07-27 DIAGNOSIS — M5441 Lumbago with sciatica, right side: Secondary | ICD-10-CM

## 2023-07-27 MED ORDER — ALLOPURINOL 300 MG PO TABS: 300.0000 mg | ORAL_TABLET | Freq: Every day | ORAL | 5 refills | Status: AC

## 2023-07-27 MED ORDER — PREDNISONE 10 MG (21) PO TBPK: ORAL_TABLET | ORAL | 0 refills | Status: DC

## 2023-07-27 MED ORDER — NAPROXEN 500 MG PO TABS: 500.0000 mg | ORAL_TABLET | Freq: Two times a day (BID) | ORAL | 4 refills | Status: DC

## 2023-07-27 NOTE — Progress Notes (Unsigned)
 Office Visit Note   Patient: Marcus Ball           Date of Birth: September 15, 1970           MRN: [id number] Visit Date: 07/27/2023              Requested by: No referring provider defined for this encounter. PCP: Patient, No Pcp Per   Assessment & Plan: Visit Diagnoses:  1. Acute right-sided low back pain with right-sided sciatica     Plan: Allopurinol renewed several refills he needs to take it every day.  The Naprosyn would be on as needed when he has any symptoms of breakthrough not controlled with allopurinol.  Will send him in a prednisone Dosepak for likely a disc protrusion on the right side.  He is having persistent problems with radicular symptoms and therapy versus MRI study imaging with and without contrast can be considered.  Patient did have a post surgery MRI which showed some mild residual/recurrence with slight downturn centrally at the L4-5 level.  Patient denies any left leg symptoms currently only right.  He will call if he is not getting relief from the Medrol Dosepak.  Follow-Up Instructions: Return if symptoms worsen or fail to improve.   Orders:  Orders Placed This Encounter  Procedures   XR Lumbar Spine 2-3 Views   Meds ordered this encounter  Medications   predniSONE (STERAPRED UNI-PAK 21 TAB) 10 MG (21) TBPK tablet    Sig: Take 6,5,4,3,2,1 one tablet less each day with food    Dispense:  21 tablet    Refill:  0   allopurinol (ZYLOPRIM) 300 MG tablet    Sig: Take 1 tablet (300 mg total) by mouth daily.    Dispense:  90 tablet    Refill:  5   naproxen (NAPROSYN) 500 MG tablet    Sig: Take 1 tablet (500 mg total) by mouth 2 (two) times daily with a meal.    Dispense:  60 tablet    Refill:  4      Procedures: No procedures performed   Clinical Data: No additional findings.   Subjective: Chief Complaint  Patient presents with   Lower Back - Pain    HPI 53 year old male with history of left L4-5 microdiscectomy by May 2005 is seen  with onset of back and right leg pain x 1 week.  Pain down his right leg is on the opposite side where he previously had symptoms.  Patient had epidural injection 2 years after surgery and had been doing well until a week ago.  He has had numbness and tingling in his right leg states it is getting worse she is to use Naprosyn with not relief.  He has not been able to work has trouble walking.  History of hereditary hemorrhagic telangiectasia with repetitive GI bleed problems and he receives iron infusions and has had transfusions in the past.  History of AV malformation noted on endoscopy.  Long history of gout he takes allopurinol but really does not take it every day which we discussed in detail.  He uses Naprosyn when he has a flare.  Review of Systems all other systems are noncontributory to HPI.   Objective: Vital Signs: BP 126/85   Pulse 72   Ht 6' (1.829 m)   Wt 210 lb (95.3 kg)   BMI 28.48 kg/m   Physical Exam Constitutional:      Appearance: He is well-developed.  HENT:     Head: Normocephalic  and atraumatic.     Right Ear: External ear normal.     Left Ear: External ear normal.  Eyes:     Pupils: Pupils are equal, round, and reactive to light.  Neck:     Thyroid: No thyromegaly.     Trachea: No tracheal deviation.  Cardiovascular:     Rate and Rhythm: Normal rate.  Pulmonary:     Effort: Pulmonary effort is normal.     Breath sounds: No wheezing.  Abdominal:     General: Bowel sounds are normal.     Palpations: Abdomen is soft.  Musculoskeletal:     Cervical back: Neck supple.  Skin:    General: Skin is warm and dry.     Capillary Refill: Capillary refill takes less than 2 seconds.  Neurological:     Mental Status: He is alert and oriented to person, place, and time.  Psychiatric:        Behavior: Behavior normal.        Thought Content: Thought content normal.        Judgment: Judgment normal.     Ortho Exam patient's pain straight leg raising on the right at  80 degrees.  Knee and ankle jerk are intact he is able to heel and toe walk.  Lumbar incisions well-healed.  Specialty Comments:  No specialty comments available.  Imaging: XR Lumbar Spine 2-3 Views Result Date: 07/28/2023 AP lateral lumbar images are obtained and reviewed.  There is some sclerotic endplate changes at L1-2 which is apex of mild lumbar curvature.  L4-5 disc space narrowing without listhesis is noted. 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   Bell's palsy 02/24/2012   Hereditary hemorrhagic telangiectasia (HCC) 02/24/2012   History of arteriovenous malformation (AVM) 06/18/2011   Past Medical History:  Diagnosis Date   HHT (hereditary hemorrhagic telangiectasia) (HCC)     Family History  Problem Relation Age of Onset   Healthy Mother    Healthy Sister    Stroke Maternal Grandfather     Past Surgical History:  Procedure Laterality Date   BRAIN SURGERY     COLONOSCOPY WITH PROPOFOL N/A 11/26/2021   Procedure: COLONOSCOPY WITH PROPOFOL;  Surgeon: Shellia Cleverly, DO;  Location: [facility] ENDOSCOPY;  Service: Gastroenterology;  Laterality: N/A;   ESOPHAGOGASTRODUODENOSCOPY (EGD) WITH PROPOFOL N/A 11/26/2021   Procedure: ESOPHAGOGASTRODUODENOSCOPY (EGD) WITH PROPOFOL;  Surgeon: Shellia Cleverly, DO;  Location: [facility] ENDOSCOPY;  Service: Gastroenterology;  Laterality: N/A;   HEMOSTASIS CLIP PLACEMENT  11/26/2021   Procedure: HEMOSTASIS CLIP PLACEMENT;  Surgeon: Shellia Cleverly, DO;  Location: [facility] ENDOSCOPY;  Service: Gastroenterology;;    HOT HEMOSTASIS N/A 11/26/2021   Procedure: HOT HEMOSTASIS (ARGON PLASMA COAGULATION/BICAP);  Surgeon: Shellia Cleverly, DO;  Location: [facility] ENDOSCOPY;  Service: Gastroenterology;  Laterality: N/A;   LUNG SURGERY     Social History   Occupational History   Occupation: Catering manager: [employer]  Tobacco Use   Smoking status: Never   Smokeless tobacco: Never  Vaping Use   Vaping status: Never Used  Substance and Sexual Activity   Alcohol use: No   Drug use: No   Sexual activity: Yes    Partners: Female

## 2023-08-04 ENCOUNTER — Telehealth: Payer: Self-pay | Admitting: Orthopaedic Surgery

## 2023-08-04 NOTE — Telephone Encounter (Signed)
 Patient states the back pain is getting worse and wants to know what to do.

## 2023-08-05 ENCOUNTER — Encounter: Payer: Self-pay | Admitting: Orthopaedic Surgery

## 2023-08-05 ENCOUNTER — Telehealth: Payer: Self-pay | Admitting: Orthopaedic Surgery

## 2023-08-05 ENCOUNTER — Other Ambulatory Visit: Payer: Self-pay | Admitting: Orthopaedic Surgery

## 2023-08-05 DIAGNOSIS — M5441 Lumbago with sciatica, right side: Secondary | ICD-10-CM

## 2023-08-05 MED ORDER — METHOCARBAMOL 500 MG PO TABS: 500.0000 mg | ORAL_TABLET | Freq: Three times a day (TID) | ORAL | 1 refills | Status: DC | PRN

## 2023-08-05 MED ORDER — TIZANIDINE HCL 4 MG PO TABS: 4.0000 mg | ORAL_TABLET | Freq: Two times a day (BID) | ORAL | 0 refills | Status: DC | PRN

## 2023-08-05 MED ORDER — PREDNISONE 10 MG (21) PO TBPK: ORAL_TABLET | ORAL | 0 refills | Status: DC

## 2023-08-05 NOTE — Telephone Encounter (Signed)
 Patient called. Would like prednisone called in and would like pain medication. Would like a call.

## 2023-08-09 MED ORDER — ACETAMINOPHEN-CODEINE 300-30 MG PO TABS: 1.0000 | ORAL_TABLET | Freq: Two times a day (BID) | ORAL | 0 refills | Status: DC | PRN

## 2023-08-09 NOTE — Telephone Encounter (Signed)
 FYI  Dr. Magnus Ivan sent in prednisone refill as well as muscle relaxer last Thursday. I spoke with patient. He continues to have pain even though the meds have helped slightly. He did want to try the tylenol 3 and have the MRI.  I did not refill prednisone since Dr. Magnus Ivan had just sent in refill. I have advised patient to follow up with Dr. Christell Constant for scan review.

## 2023-08-09 NOTE — Telephone Encounter (Signed)
 noted

## 2023-08-16 NOTE — Telephone Encounter (Signed)
 Noted. Will wait to hear back from patient in regards to what hematologist recommends.

## 2023-08-25 ENCOUNTER — Ambulatory Visit
Admission: RE | Admit: 2023-08-25 | Discharge: 2023-08-25 | Disposition: A | Discharge status: Home / Self Care | Source: Ambulatory Visit | Attending: Orthopaedic Surgery | Admitting: Orthopaedic Surgery

## 2023-08-25 DIAGNOSIS — M5441 Lumbago with sciatica, right side: Secondary | ICD-10-CM

## 2023-09-20 ENCOUNTER — Encounter: Admitting: Physical Medicine and Rehabilitation

## 2023-09-27 ENCOUNTER — Telehealth: Payer: Self-pay | Admitting: Physical Medicine and Rehabilitation

## 2023-09-27 NOTE — Telephone Encounter (Signed)
 Patient call and wants to reschdedule the injection for tomorrow appt. [phone]

## 2023-09-28 ENCOUNTER — Encounter: Admitting: Physical Medicine and Rehabilitation

## 2023-10-13 ENCOUNTER — Other Ambulatory Visit: Payer: Self-pay

## 2023-10-13 ENCOUNTER — Ambulatory Visit: Admitting: Physical Medicine and Rehabilitation

## 2023-10-13 VITALS — BP 135/79 | HR 72

## 2023-10-13 DIAGNOSIS — M5416 Radiculopathy, lumbar region: Secondary | ICD-10-CM | POA: Diagnosis not present

## 2023-10-13 MED ORDER — METHYLPREDNISOLONE ACETATE 40 MG/ML IJ SUSP
40.0000 mg | Freq: Once | INTRAMUSCULAR | Status: AC
  Administered 2023-10-13: 40 mg

## 2023-10-13 NOTE — Progress Notes (Signed)
 Pain Scale   Average Pain 7 Patient advising that his lower back pain radiates to his right leg at times.        +Driver, -BT, -Dye Allergies.

## 2023-10-13 NOTE — Patient Instructions (Signed)

## 2023-10-16 NOTE — Progress Notes (Signed)
 Marcus Ball - 53 y.o. male MRN [id number]  Date of birth: July 09, 1970  Office Visit Note: Visit Date: 10/13/2023 PCP: Patient, No Pcp Per Referred by: Diedra Fowler, MD  Subjective: Chief Complaint  Patient presents with   Lower Back - Pain   HPI:  Marcus Ball is a 53 y.o. male who comes in today at the request of Dr. Colette Davies for planned Right L4-5 Lumbar Transforaminal epidural steroid injection with fluoroscopic guidance.  The patient has failed conservative care including home exercise, medications, time and activity modification.  This injection will be diagnostic and hopefully therapeutic.  Please see requesting physician notes for further details and justification.   ROS Otherwise per HPI.  Assessment & Plan: Visit Diagnoses:    ICD-10-CM   1. Lumbar radiculopathy  M54.16 XR C-ARM NO REPORT    Epidural Steroid injection    methylPREDNISolone  acetate (DEPO-MEDROL ) injection 40 mg      Plan: No additional findings.   Meds & Orders:  Meds ordered this encounter  Medications   methylPREDNISolone  acetate (DEPO-MEDROL ) injection 40 mg    Orders Placed This Encounter  Procedures   XR C-ARM NO REPORT   Epidural Steroid injection    Follow-up: Return for visit to requesting provider as needed.   Procedures: No procedures performed  Lumbosacral Transforaminal Epidural Steroid Injection - Sub-Pedicular Approach with Fluoroscopic Guidance  Patient: Marcus Ball      Date of Birth: [birth year] MRN: [id number] PCP: Patient, No Pcp Per      Visit Date: 10/13/2023   Universal Protocol:    Date/Time: 10/13/2023  Consent Given By: the patient  Position: PRONE  Additional Comments: Vital signs were monitored before and after the procedure. Patient was prepped and draped in the usual sterile fashion. The correct patient, procedure, and site was verified.   Injection Procedure Details:   Procedure diagnoses: Lumbar radiculopathy  [M54.16]    Meds Administered:  Meds ordered this encounter  Medications   methylPREDNISolone  acetate (DEPO-MEDROL ) injection 40 mg    Laterality: Right  Location/Site: L4  Needle:5.0 in., 22 ga.  Short bevel or Quincke spinal needle  Needle Placement: Transforaminal  Findings:    -Comments: Excellent flow of contrast along the nerve, nerve root and into the epidural space.  Procedure Details: After squaring off the end-plates to get a true AP view, the C-arm was positioned so that an oblique view of the foramen as noted above was visualized. The target area is just inferior to the "nose of the scotty dog" or sub pedicular. The soft tissues overlying this structure were infiltrated with 2-3 ml. of 1% Lidocaine  without Epinephrine .  The spinal needle was inserted toward the target using a "trajectory" view along the fluoroscope beam.  Under AP and lateral visualization, the needle was advanced so it did not puncture dura and was located close the 6 O'Clock position of the pedical in AP tracterory. Biplanar projections were used to confirm position. Aspiration was confirmed to be negative for CSF and/or blood. A 1-2 ml. volume of Isovue-250 was injected and flow of contrast was noted at each level. Radiographs were obtained for documentation purposes.   After attaining the desired flow of contrast documented above, a 0.5 to 1.0 ml test dose of 0.25% Marcaine was injected into each respective transforaminal space.  The patient was observed for 90 seconds post injection.  After no sensory deficits were reported, and normal lower extremity motor function was noted,   the above injectate  was administered so that equal amounts of the injectate were placed at each foramen (level) into the transforaminal epidural space.   Additional Comments:  The patient tolerated the procedure well Dressing: 2 x 2 sterile gauze and Band-Aid    Post-procedure details: Patient was observed during the  procedure. Post-procedure instructions were reviewed.  Patient left the clinic in stable condition.    Clinical History: No specialty comments available.     Objective:  VS:  HT:    WT:   BMI:     BP:135/79  HR:72bpm  TEMP: ( )  RESP:  Physical Exam Vitals and nursing note reviewed.  Constitutional:      General: He is not in acute distress.    Appearance: Normal appearance. He is not ill-appearing.  HENT:     Head: Normocephalic and atraumatic.     Right Ear: External ear normal.     Left Ear: External ear normal.     Nose: No congestion.  Eyes:     Extraocular Movements: Extraocular movements intact.  Cardiovascular:     Rate and Rhythm: Normal rate.     Pulses: Normal pulses.  Pulmonary:     Effort: Pulmonary effort is normal. No respiratory distress.  Abdominal:     General: There is no distension.     Palpations: Abdomen is soft.  Musculoskeletal:        General: No tenderness or signs of injury.     Cervical back: Neck supple.     Right lower leg: No edema.     Left lower leg: No edema.     Comments: Patient has good distal strength without clonus.  Skin:    Findings: No erythema or rash.  Neurological:     General: No focal deficit present.     Mental Status: He is alert and oriented to person, place, and time.     Sensory: No sensory deficit.     Motor: No weakness or abnormal muscle tone.     Coordination: Coordination normal.  Psychiatric:        Mood and Affect: Mood normal.        Behavior: Behavior normal.      Imaging: No results found.

## 2023-10-16 NOTE — Procedures (Signed)
 Lumbosacral Transforaminal Epidural Steroid Injection - Sub-Pedicular Approach with Fluoroscopic Guidance  Patient: Marcus Ball      Date of Birth: 1970/10/21 MRN: [id number] PCP: Patient, No Pcp Per      Visit Date: 10/13/2023   Universal Protocol:    Date/Time: 10/13/2023  Consent Given By: the patient  Position: PRONE  Additional Comments: Vital signs were monitored before and after the procedure. Patient was prepped and draped in the usual sterile fashion. The correct patient, procedure, and site was verified.   Injection Procedure Details:   Procedure diagnoses: Lumbar radiculopathy [M54.16]    Meds Administered:  Meds ordered this encounter  Medications   methylPREDNISolone  acetate (DEPO-MEDROL ) injection 40 mg    Laterality: Right  Location/Site: L4  Needle:5.0 in., 22 ga.  Short bevel or Quincke spinal needle  Needle Placement: Transforaminal  Findings:    -Comments: Excellent flow of contrast along the nerve, nerve root and into the epidural space.  Procedure Details: After squaring off the end-plates to get a true AP view, the C-arm was positioned so that an oblique view of the foramen as noted above was visualized. The target area is just inferior to the "nose of the scotty dog" or sub pedicular. The soft tissues overlying this structure were infiltrated with 2-3 ml. of 1% Lidocaine  without Epinephrine .  The spinal needle was inserted toward the target using a "trajectory" view along the fluoroscope beam.  Under AP and lateral visualization, the needle was advanced so it did not puncture dura and was located close the 6 O'Clock position of the pedical in AP tracterory. Biplanar projections were used to confirm position. Aspiration was confirmed to be negative for CSF and/or blood. A 1-2 ml. volume of Isovue-250 was injected and flow of contrast was noted at each level. Radiographs were obtained for documentation purposes.   After attaining the  desired flow of contrast documented above, a 0.5 to 1.0 ml test dose of 0.25% Marcaine was injected into each respective transforaminal space.  The patient was observed for 90 seconds post injection.  After no sensory deficits were reported, and normal lower extremity motor function was noted,   the above injectate was administered so that equal amounts of the injectate were placed at each foramen (level) into the transforaminal epidural space.   Additional Comments:  The patient tolerated the procedure well Dressing: 2 x 2 sterile gauze and Band-Aid    Post-procedure details: Patient was observed during the procedure. Post-procedure instructions were reviewed.  Patient left the clinic in stable condition.

## 2023-10-25 ENCOUNTER — Telehealth: Payer: Self-pay | Admitting: Orthopedic Surgery

## 2023-10-25 NOTE — Telephone Encounter (Signed)
 Patient called and can you send some muscle relaxers in for his pain. MW#[phone]

## 2023-10-26 ENCOUNTER — Encounter: Payer: Self-pay | Admitting: Orthopedic Surgery

## 2023-10-26 DIAGNOSIS — M5416 Radiculopathy, lumbar region: Secondary | ICD-10-CM

## 2023-10-27 MED ORDER — METHYLPREDNISOLONE 4 MG PO TBPK: ORAL_TABLET | ORAL | 0 refills | Status: DC

## 2023-11-10 ENCOUNTER — Ambulatory Visit: Payer: Self-pay | Admitting: Orthopedic Surgery

## 2023-11-18 ENCOUNTER — Ambulatory Visit (INDEPENDENT_AMBULATORY_CARE_PROVIDER_SITE_OTHER): Admitting: Orthopedic Surgery

## 2023-11-18 ENCOUNTER — Other Ambulatory Visit (INDEPENDENT_AMBULATORY_CARE_PROVIDER_SITE_OTHER): Payer: Self-pay

## 2023-11-18 VITALS — BP 116/77 | HR 69 | Ht 71.0 in | Wt 216.0 lb

## 2023-11-18 DIAGNOSIS — M545 Low back pain, unspecified: Secondary | ICD-10-CM

## 2023-11-18 NOTE — Progress Notes (Signed)
 Orthopedic Spine Surgery Office Note  Assessment: Patient is a 53 y.o. 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 Discussed that the conservative treatments include:  -activity modification  -physical therapy  -over the counter pain medications  -medrol  dosepak  -lumbar steroid injections -Patient has tried  tylenol , naproxen , medrol  dose pak, lumbar steroid injection  -Recommended continue with his already arranged PT for six weeks -Patient should return to office in 7-8 weeks, x-rays at next visit: none   Patient expressed understanding of the plan and all questions were answered to the patient's satisfaction.   ___________________________________________________________________________   History:  Patient is a 53 y.o. male who presents today for lumbar spine. Patient has a history of prior L4/5 left sided hemilaminotomy and microdiscectomy about 15 years. He had been doing well after surgery until March of 2025. There was not trauma or injury at that time but he developed low back and radiating right leg pain. He felt the pain radiating into his thigh and leg. Since that time, he has tried multiple conservative treatments and his radiating leg pain has improved. He still has some lower back pain that he said is worse with certain activities. He notices it with bending and getting up from a seated position if he has been seated for awhile. It is better if he is laying down.    Weakness: denies Symptoms of imbalance: denies Paresthesias and numbness: denies Bowel or bladder incontinence: denies Saddle anesthesia: denies  Treatments tried: tylenol , naproxen , medrol  dose pak, lumbar steroid injection   Review of systems: Denies fevers and chills, night sweats, unexplained weight loss, history of cancer,  pain that wakes them at night  Past medical history: HHT  Allergies: penicillin, sulfa  Past surgical history:  Intracranial AVM surgery Lung surgey  Social history: Denies use of nicotine product (smoking, vaping, patches, smokeless) Alcohol use: denies Denies recreational drug use   Physical Exam:  BMI of 30.1  General: no acute distress, appears stated age Neurologic: alert, answering questions appropriately, following commands Respiratory: unlabored breathing on room air, symmetric chest rise Psychiatric: appropriate affect, normal cadence to speech   MSK (spine):  -Strength exam      Left  Right EHL    5/5  5/5 TA    5/5  5/5 GSC    5/5  5/5 Knee extension  5/5  5/5 Hip flexion   5/5  5/5  -Sensory exam    Sensation intact to light touch in L3-S1 nerve distributions of bilateral lower extremities  -Achilles DTR: 2/4 on the left, 2/4 on the right -Patellar tendon DTR: 2/4 on the left, 2/4 on the right  -Straight leg raise: Had back pain with straight leg raise on the right, no radiating leg pain with either side -Clonus: no beats bilaterally  -Left hip exam: no pain through range of motion -Right hip exam: no pain through range of motion  Imaging: XRs of the lumbar spine from 11/17/2023 were independently reviewed and interpreted, showing disc height loss at L4/5. No other significant degenerative changes. No evidence of instability on flexion/extension views. No fracture or dislocation seen.  MRI of the lumbar spine from 08/25/2023 was independently reviewed and interpreted, showing DDD at L4/5. Disc bulge and lateral recess stenosis on the right at L4/5. Prior hemilaminotomy defect on the left at L4/5.    Patient name: Marcus Ball  Marcus Ball Patient MRN: [id number] Date of visit: 11/18/23

## 2023-12-01 ENCOUNTER — Ambulatory Visit: Admitting: Orthopedic Surgery

## 2023-12-02 ENCOUNTER — Encounter (HOSPITAL_COMMUNITY): Payer: Self-pay

## 2023-12-02 ENCOUNTER — Observation Stay (HOSPITAL_COMMUNITY)
Admission: EM | Admit: 2023-12-02 | Discharge: 2023-12-04 | Disposition: A | Discharge status: Home / Self Care | Source: Ambulatory Visit | Attending: Internal Medicine | Admitting: Internal Medicine

## 2023-12-02 ENCOUNTER — Other Ambulatory Visit: Payer: Self-pay

## 2023-12-02 DIAGNOSIS — K31819 Angiodysplasia of stomach and duodenum without bleeding: Secondary | ICD-10-CM | POA: Diagnosis not present

## 2023-12-02 DIAGNOSIS — R42 Dizziness and giddiness: Secondary | ICD-10-CM | POA: Diagnosis present

## 2023-12-02 DIAGNOSIS — D509 Iron deficiency anemia, unspecified: Secondary | ICD-10-CM | POA: Diagnosis not present

## 2023-12-02 DIAGNOSIS — D62 Acute posthemorrhagic anemia: Principal | ICD-10-CM

## 2023-12-02 DIAGNOSIS — K5521 Angiodysplasia of colon with hemorrhage: Secondary | ICD-10-CM | POA: Diagnosis not present

## 2023-12-02 DIAGNOSIS — R55 Syncope and collapse: Secondary | ICD-10-CM | POA: Insufficient documentation

## 2023-12-02 DIAGNOSIS — K5520 Angiodysplasia of colon without hemorrhage: Secondary | ICD-10-CM | POA: Insufficient documentation

## 2023-12-02 DIAGNOSIS — M109 Gout, unspecified: Secondary | ICD-10-CM

## 2023-12-02 DIAGNOSIS — K3189 Other diseases of stomach and duodenum: Secondary | ICD-10-CM | POA: Insufficient documentation

## 2023-12-02 DIAGNOSIS — I780 Hereditary hemorrhagic telangiectasia: Secondary | ICD-10-CM | POA: Diagnosis present

## 2023-12-02 DIAGNOSIS — D649 Anemia, unspecified: Principal | ICD-10-CM

## 2023-12-02 LAB — URINALYSIS, ROUTINE W REFLEX MICROSCOPIC
Bilirubin Urine: NEGATIVE
Glucose, UA: NEGATIVE mg/dL
Hgb urine dipstick: NEGATIVE
Ketones, ur: NEGATIVE mg/dL
Leukocytes,Ua: NEGATIVE
Nitrite: NEGATIVE
Protein, ur: NEGATIVE mg/dL
Specific Gravity, Urine: 1.01 (ref 1.005–1.030)
pH: 6 (ref 5.0–8.0)

## 2023-12-02 LAB — COMPREHENSIVE METABOLIC PANEL WITH GFR
ALT: 24 U/L (ref 0–44)
AST: 24 U/L (ref 15–41)
Albumin: 4 g/dL (ref 3.5–5.0)
Alkaline Phosphatase: 81 U/L (ref 38–126)
Anion gap: 9 (ref 5–15)
BUN: 13 mg/dL (ref 6–20)
CO2: 23 mmol/L (ref 22–32)
Calcium: 9 mg/dL (ref 8.9–10.3)
Chloride: 106 mmol/L (ref 98–111)
Creatinine, Ser: 0.92 mg/dL (ref 0.61–1.24)
GFR, Estimated: >60 mL/min
Glucose, Bld: 103 mg/dL — ABNORMAL HIGH (ref 70–99)
Potassium: 3.8 mmol/L (ref 3.5–5.1)
Sodium: 138 mmol/L (ref 135–145)
Total Bilirubin: 0.6 mg/dL (ref 0.0–1.2)
Total Protein: 6.9 g/dL (ref 6.5–8.1)

## 2023-12-02 LAB — FOLATE: Folate: 16.5 ng/mL (ref >5.9)

## 2023-12-02 LAB — FERRITIN: Ferritin: 5 ng/mL — ABNORMAL LOW (ref 24–336)

## 2023-12-02 LAB — CBC
HCT: 25.1 % — ABNORMAL LOW (ref 39.0–52.0)
Hemoglobin: 7 g/dL — ABNORMAL LOW (ref 13.0–17.0)
MCH: 21.4 pg — ABNORMAL LOW (ref 26.0–34.0)
MCHC: 27.9 g/dL — ABNORMAL LOW (ref 30.0–36.0)
MCV: 76.8 fL — ABNORMAL LOW (ref 80.0–100.0)
Platelets: 289 K/uL (ref 150–400)
RBC: 3.27 MIL/uL — ABNORMAL LOW (ref 4.22–5.81)
RDW: 16.6 % — ABNORMAL HIGH (ref 11.5–15.5)
WBC: 5.1 K/uL (ref 4.0–10.5)
nRBC: 0 % (ref 0.0–0.2)

## 2023-12-02 LAB — IRON AND TIBC
Iron: 11 ug/dL — ABNORMAL LOW (ref 45–182)
Saturation Ratios: 2 % — ABNORMAL LOW (ref 17.9–39.5)
TIBC: 584 ug/dL — ABNORMAL HIGH (ref 250–450)
UIBC: 573 ug/dL

## 2023-12-02 LAB — RETICULOCYTES
Immature Retic Fract: 7.2 % (ref 2.3–15.9)
RBC.: 3.21 MIL/uL — ABNORMAL LOW (ref 4.22–5.81)
Retic Count, Absolute: 70 K/uL (ref 19.0–186.0)
Retic Ct Pct: 2.1 % (ref 0.4–3.1)

## 2023-12-02 LAB — PREPARE RBC (CROSSMATCH)

## 2023-12-02 LAB — HIV ANTIBODY (ROUTINE TESTING W REFLEX): HIV Screen 4th Generation wRfx: NONREACTIVE

## 2023-12-02 LAB — CBG MONITORING, ED: Glucose-Capillary: 100 mg/dL — ABNORMAL HIGH (ref 70–99)

## 2023-12-02 LAB — VITAMIN B12: Vitamin B-12: 385 pg/mL (ref 180–914)

## 2023-12-02 MED ORDER — ACETAMINOPHEN 325 MG PO TABS
650.0000 mg | ORAL_TABLET | Freq: Four times a day (QID) | ORAL | Status: DC | PRN
Start: 2023-12-02 — End: 2023-12-04

## 2023-12-02 MED ORDER — ACETAMINOPHEN 650 MG RE SUPP: 650.0000 mg | Freq: Four times a day (QID) | RECTAL | Status: DC | PRN

## 2023-12-02 MED ORDER — PANTOPRAZOLE SODIUM 40 MG IV SOLR
40.0000 mg | Freq: Once | INTRAVENOUS | Status: AC
  Administered 2023-12-02: 40 mg via INTRAVENOUS
  Filled 2023-12-02: qty 10

## 2023-12-02 MED ORDER — SODIUM CHLORIDE 0.9% IV SOLUTION: Freq: Once | INTRAVENOUS | Status: AC

## 2023-12-02 NOTE — Hospital Course (Addendum)
 Admitted 12/02/2023  Allergies: Other, Penicillins, and Sulfa antibiotics Pertinent Hx: HHT with duodenal AVM, IDA, pulmonary AVM, recurrent epistaxis  53 y.o. male p/w abnormal labs per HHT doc  * AonC Anemia: No overt bleeding. IV PPI and NPO for possible EGD in the AM.   Consults: GI VTE ppx: SCDs  Diet: CLD->NPO at MN    Dorsey - NPO at [facility]  Feeling weak, dizzy over the past 2 weeks Lightheaded when he stands HA last week; no migraine. None this week  Naprosyn  for 3 days last week How often do you have gout flares? Has been cramping, leg cramps in the morning, like restless.   No chest pain? Palpitations Falls? Nausea or vomiting Dyspepsia  Steroid injection in the back Oral steroid - a week   No     Lab work prior to admission, collected at [facility] Ferritin 11 Iron  saturation 3 Hgb 6. 75 RDW 16.6  Reticulocyte index:  09/2023 lab work Hgb 13.1 MCV 82.2 Ferritin 24 Iron  saturat 9 Iron  infusion in May?  Last infusion was in March 2025  Add to history: punctate remote left cerebellar lacunar infarctions  Status post posterior fossa craniotomy for AVM resection.     ----   Thank you for allowing us  to be part of your care. You were hospitalized for abnormal lab values and symptomatic anemia. While you were hospitalized, our Gastroenterology team was in touch with your doctor at the HHT clinic. With their support, you were given 2 units of blood, 1000 mg of IV iron  Dextran. 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 Post procedure, your Hgb was 10.   You are now stable for a safe discharge home with the following recommendations:  *For your hx of HHT and iron  deficiency  - Follow up at HHT clinic in the next 14 days  - Have a repeat cbc in 7-10 days  - Continue oral iron   - Follow up iron  studies at HHT clinic  - Discuss the potential for long  acting octreotide with your HHT doctor  It is important that you AVOID all NSAIDS, including Naproxen , to avoid further erosions in your stomach. Continue taking your allopurinol  daily to help reduce the number of gout flares that lead you take Naproxen  for relief.   If you do have a gout flare, have your primary care doctor order prednisone  or do a steroid injection if it is only one joint  Have your primary care doctor follow up with the results of your endoscopy biopsy regarding H pylori (bacteria) testing.    FOLLOW UP APPOINTMENTS: Follow up with your family doctor  in 7-14 days  If you decide to follow up with [facility], with Dr. Stacia, make an appointment at [phone]. Let them know that you need a hospital follow up appointment.  However, if you would like to consolidate your care at [facility], please request an appointment with their group.  It was a pleasure taking care of you!  Please call your PCP or our clinic if you have any questions or concerns, we may be able to help and keep you from a long and expensive emergency room wait. Our clinic and after hours phone number is [phone]. The best time to call is Monday through Friday 9 am to 4 pm but there is always someone available 24/7 if you have an emergency. If you need medication refills  please notify your pharmacy one week in advance and they will send us  a request.   We are glad you are feeling better,  Hadassah Ala Internal Medicine Inpatient Teaching Service at [facility]

## 2023-12-02 NOTE — ED Notes (Signed)
 Pt denies any chest pain, headache, or shob at this time. No acute distress. Wife at bedside.

## 2023-12-02 NOTE — ED Provider Triage Note (Signed)
 Emergency Medicine Provider Triage Evaluation Note  Marcus Ball , a 53 y.o. male  was evaluated in triage.  Pt complains of weakness. Pt has hx of blood disorder, low iron, scheduled to get iron infusion in August however he report progressive worsening fatigue for the past several month.  No abnormal bleed.  No cp, sob or abd pain.  Recent Hgb 6.8 and needing blood transfusion  Review of Systems  Positive: As above Negative: As above  Physical Exam  BP (!) 142/82 (BP Location: Right Arm)   Pulse 78   Temp 98.4 F (36.9 C)   Resp 18   Ht 6' (1.829 m)   Wt 95.3 kg   SpO2 100%   BMI 28.48 kg/m  Gen:   Awake, no distress   Resp:  Normal effort  MSK:   Moves extremities without difficulty  Other:    Medical Decision Making  Medically screening exam initiated at 12:50 PM.  Appropriate orders placed.  Marcus Ball was informed that the remainder of the evaluation will be completed by another provider, this initial triage assessment does not replace that evaluation, and the importance of remaining in the ED until their evaluation is complete.     Marcus Colon, PA-C 12/02/23 1253

## 2023-12-02 NOTE — ED Notes (Signed)
 Paper blood consent obtained and placed in medical records drawer.

## 2023-12-02 NOTE — ED Provider Notes (Signed)
 [place] EMERGENCY DEPARTMENT AT [facility] Provider Note   CSN: [id number] Arrival date & time: 12/02/23  1230     Patient presents with: Anemia   Marcus Ball is a 53 y.o. male.   53 yo M with a chief complaint of feeling weak and dizzy.  Going on for a few days.  He called his hematology clinic where he was seen for HHT.  They checked his blood level there was noted to be 6.8.  He was then told to come to the hospital for transfusion.  He denies any specific areas of bleeding.  He has had nosebleeds off and on but normal for him.  He denies dark stool or blood in the stool.  Denies abdominal pain.  Denies hemoptysis.   Anemia       Prior to Admission medications   Medication Sig Start Date End Date Taking? Authorizing Provider  acetaminophen -codeine  (TYLENOL  #3) 300-30 MG tablet Take 1 tablet by mouth 2 (two) times daily as needed for moderate pain (pain score 4-6). 08/09/23   Barbarann Oneil BROCKS, MD  allopurinol  (ZYLOPRIM ) 300 MG tablet Take 1 tablet (300 mg total) by mouth daily. 07/27/23   Barbarann Oneil BROCKS, MD  azelastine  (ASTELIN ) 0.1 % nasal spray Place 2 sprays into both nostrils 2 (two) times daily. Use in each nostril as directed Patient taking differently: Place 2 sprays into both nostrils 2 (two) times daily. 03/10/17   Juliane Che, PA  ferrous sulfate  325 (65 FE) MG tablet Take 1 tablet (325 mg total) by mouth 2 (two) times daily with a meal for 30 days, THEN 1 tablet (325 mg total) daily with breakfast. 11/27/21 01/26/22  Tobie Yetta CHRISTELLA, MD  methocarbamol  (ROBAXIN ) 500 MG tablet Take 1 tablet (500 mg total) by mouth every 8 (eight) hours as needed. 08/05/23   Vernetta Lonni GRADE, MD  methylPREDNISolone  (MEDROL  DOSEPAK) 4 MG TBPK tablet Take as prescribed on the box 10/27/23   Georgina Ozell LABOR, MD  naproxen  (NAPROSYN ) 500 MG tablet Take 1 tablet (500 mg total) by mouth 2 (two) times daily with a meal. 07/27/23   Barbarann Oneil BROCKS, MD  predniSONE  (STERAPRED  UNI-PAK 21 TAB) 10 MG (21) TBPK tablet Take 6,5,4,3,2,1 one tablet less each day with food 08/05/23   Vernetta Lonni GRADE, MD    Allergies: Other, Penicillins, and Sulfa antibiotics    Review of Systems  Updated Vital Signs BP 136/83 (BP Location: Right Arm)   Pulse 70   Temp 98 F (36.7 C)   Resp 18   Ht 6' (1.829 m)   Wt 95.3 kg   SpO2 100%   BMI 28.48 kg/m   Physical Exam Vitals and nursing note reviewed.  Constitutional:      Appearance: He is well-developed.     Comments: pale  HENT:     Head: Normocephalic and atraumatic.  Eyes:     Pupils: Pupils are equal, round, and reactive to light.  Neck:     Vascular: No JVD.  Cardiovascular:     Rate and Rhythm: Normal rate and regular rhythm.     Heart sounds: No murmur heard.    No friction rub. No gallop.  Pulmonary:     Effort: No respiratory distress.     Breath sounds: No wheezing.  Abdominal:     General: There is no distension.     Tenderness: There is no abdominal tenderness. There is no guarding or rebound.  Musculoskeletal:  General: Normal range of motion.     Cervical back: Normal range of motion and neck supple.  Skin:    Coloration: Skin is not pale.     Findings: No rash.  Neurological:     Mental Status: He is alert and oriented to person, place, and time.  Psychiatric:        Behavior: Behavior normal.     (all labs ordered are listed, but only abnormal results are displayed) Labs Reviewed  COMPREHENSIVE METABOLIC PANEL WITH GFR - Abnormal; Notable for the following components:      Result Value   Glucose, Bld 103 (*)    All other components within normal limits  CBC - Abnormal; Notable for the following components:   RBC 3.27 (*)    Hemoglobin 7.0 (*)    HCT 25.1 (*)    MCV 76.8 (*)    MCH 21.4 (*)    MCHC 27.9 (*)    RDW 16.6 (*)    All other components within normal limits  IRON AND TIBC - Abnormal; Notable for the following components:   Iron 11 (*)    TIBC 584 (*)     Saturation Ratios 2 (*)    All other components within normal limits  FERRITIN - Abnormal; Notable for the following components:   Ferritin 5 (*)    All other components within normal limits  RETICULOCYTES - Abnormal; Notable for the following components:   RBC. 3.21 (*)    All other components within normal limits  CBG MONITORING, ED - Abnormal; Notable for the following components:   Glucose-Capillary 100 (*)    All other components within normal limits  VITAMIN B12  FOLATE  TYPE AND SCREEN  PREPARE RBC (CROSSMATCH)    EKG: None  Radiology: No results found.   .Critical Care  Performed by: Emil Share, DO Authorized by: Emil Share, DO   Critical care provider statement:    Critical care time (minutes):  35   Critical care time was exclusive of:  Separately billable procedures and treating other patients   Critical care was time spent personally by me on the following activities:  Development of treatment plan with patient or surrogate, discussions with consultants, evaluation of patient's response to treatment, examination of patient, ordering and review of laboratory studies, ordering and review of radiographic studies, ordering and performing treatments and interventions, pulse oximetry, re-evaluation of patient's condition and review of old charts   Care discussed with: admitting provider      Medications Ordered in the ED  pantoprazole  (PROTONIX ) injection 40 mg (has no administration in time range)  0.9 %  sodium chloride  infusion (Manually program via Guardrails IV Fluids) (has no administration in time range)                                    Medical Decision Making Amount and/or Complexity of Data Reviewed Labs: ordered.  Risk Prescription drug management.   53 yo M with a chief complaint of dizziness.  Patient has a history of HHT and gets frequent iron infusions due to chronic bleeding.  On my review it looks like the patient's baseline hemoglobin is  about 11.  Was 6.8 in the clinic and 7.0 here.  Will transfuse a unit of blood.  I discussed the case with [facility], Dr. Federico.  She recommended n.p.o. at midnight.  Will discuss with medicine for admission.  The patients results and plan were reviewed and discussed.   Any x-rays performed were independently reviewed by myself.   Differential diagnosis were considered with the presenting HPI.  Medications  pantoprazole  (PROTONIX ) injection 40 mg (has no administration in time range)  0.9 %  sodium chloride  infusion (Manually program via Guardrails IV Fluids) (has no administration in time range)    Vitals:   12/02/23 1237 12/02/23 1242 12/02/23 1351  BP: (!) 142/82  136/83  Pulse: 78  70  Resp: 18  18  Temp: 98.4 F (36.9 C)  98 F (36.7 C)  SpO2: 100%  100%  Weight:  95.3 kg   Height:  6' (1.829 m)     Final diagnoses:  Symptomatic anemia    Admission/ observation were discussed with the admitting physician, patient and/or family and they are comfortable with the plan.       Final diagnoses:  Symptomatic anemia    ED Discharge Orders     None          Emil Share, DO 12/02/23 1730

## 2023-12-02 NOTE — Progress Notes (Deleted)
 Date: 12/02/2023               Patient Name:  Marcus Ball MRN: [id number]  DOB: 08-Jul-1970 Age / Sex: 53 y.o., male   PCP: Patient, No Pcp Per         Medical Service: Internal Medicine Teaching Service         Attending Physician: Dr. Dayton Eastern      First Contact: Sallyanne Primas, DO}    Second Contact: Dr. Hadassah Kristy Ahr, MD          Pager Information: First Contact Pager: [phone]   Second Contact Pager: [phone]   SUBJECTIVE   Chief Complaint: Dizziness, Lightheaded, and weakness   History of Present Illness: Marcus Ball is a 53 y.o. male with PMH of HHT (hereditary hemorrhagic telangectasia), lumbar back pain, and gout with multiple flares a year.   Patient presents to the ED from his hematology clinic where they noted his Hemoglobin to be 6.8 and he was told to come to the hospital for a transfusion. Patient noted onset of symptoms starting 1 to 2 weeks ago.  He noticed feeling lightheaded when bending over at first but the symptoms have progressed over the past week now including muscle cramps over the past 3-4 days located in his diaphragm and legs. He said these are his general symptoms when he has been anemic in the past and needed blood. He notes the cramping in his legs occurs at night and is generally resolved when he gets up and moves around a little. Last medical procedure was for back pain where he received lumbar injection (7/3) with steroid and was taking 1 week of steroid oral outpatient. Patient denies any new medications. Patient notes last gout flare was 2-3 days ago where he took naproxen  twice daily for those three days; the only other medications he notes taking is tylenol  for a couple of days 2 weeks ago.   Patient's last iron infusion was March and he was scheduled for another infusion next week.   Patient endorses: HA last week (patient was at beach with family and notes probably from being in the sun), muscle cramping (diaphragm,  legs), weakness, dizziness, lightheadedness   Patient denies: Blood in stool, black/tarry stool, blood in urine, recent strenuous exercise, GERD symptoms, CP, SOB, Dyspnea, palpitations, Nausea, Vomiting, diarrhea, new bruising, pain in extremities, ibuprofen/Goody Powder use, abdominal pain, new onset severe HA  ED Course: Labs significant for:  Hgb 7.0 HCT 25.1 MCV 27.8 Platelets 289 BUN 13 Glucose 103 Iron 11 Ferritin 5 TIBC 584 Saturation Ratio 2 Reticulocyte Ct, absolute 70 B12 385 Folate 16.5 Type and Screen A+  Received:  Pantoprazole  40 mg injection once 0.9% sodium chloride  infusion 10 ml/hr once for 1 dose for blood transfusion  Acetaminophen  suppository  Transfuse 1 Unit RBC starting at 1716 12/02/2023  Consulted: GI  Meds:  Patient reported:  Allopurinol  for gout  naproxen  for gout flares   No outpatient medications have been marked as taking for the 12/02/23 encounter [facility] Encounter).    Past Medical History  Patient's was diagnosed with HHT after imaging received when having back surgery in 2005. 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   Last medical procedure was for back pain where he received lumbar injection (7/3) with steroid and was taking 1 week of steroid oral outpatient  Patient notes gout flares (2 per year)  Past Surgical History Past Surgical History:  Procedure Laterality  Date   BRAIN SURGERY     COLONOSCOPY WITH PROPOFOL  N/A 11/26/2021   Procedure: COLONOSCOPY WITH PROPOFOL ;  Surgeon: San Sandor GAILS, DO;  Location: [facility] ENDOSCOPY;  Service: Gastroenterology;  Laterality: N/A;   ESOPHAGOGASTRODUODENOSCOPY (EGD) WITH PROPOFOL  N/A 11/26/2021   Procedure: ESOPHAGOGASTRODUODENOSCOPY (EGD) WITH PROPOFOL ;  Surgeon: San Sandor GAILS, DO;  Location: [facility] ENDOSCOPY;  Service: Gastroenterology;  Laterality: N/A;   HEMOSTASIS CLIP PLACEMENT  11/26/2021   Procedure: HEMOSTASIS CLIP PLACEMENT;  Surgeon: San Sandor GAILS, DO;  Location: [facility] ENDOSCOPY;  Service: Gastroenterology;;   HOT HEMOSTASIS N/A 11/26/2021   Procedure: HOT HEMOSTASIS (ARGON PLASMA COAGULATION/BICAP);  Surgeon: San Sandor GAILS, DO;  Location: [facility] ENDOSCOPY;  Service: Gastroenterology;  Laterality: N/A;   LUNG SURGERY       Social:  Lives With: wife Occupation: Support: PCP: Patient, No Pcp Per  Substances: -Tobacco: none -Alcohol: occasional  -Recreational Drug: none  Family History:  Family History  Problem Relation Age of Onset   Healthy Mother    Healthy Sister    Stroke Maternal Grandfather      Allergies: Allergies as of 12/02/2023 - Review Complete 12/02/2023  Allergen Reaction Noted   Other Other (See Comments) 06/29/2011   Penicillins Hives 02/24/2012   Sulfa antibiotics Hives 02/24/2012    Review of Systems: A complete ROS was negative except as per HPI.   OBJECTIVE:   Physical Exam: Blood pressure 136/83, pulse 70, temperature 98 F (36.7 C), resp. rate 18, height 6' (1.829 m), weight 95.3 kg, SpO2 100%.   Physical Exam Constitutional:      Appearance: He is well-developed.  Cardiovascular:     Rate and Rhythm: Normal rate and regular rhythm.     Heart sounds: Normal heart sounds.  Pulmonary:     Effort: Pulmonary effort is normal.     Breath sounds: Normal breath sounds.  Abdominal:     General: Abdomen is flat. Bowel sounds are normal.     Palpations: Abdomen is soft.     Tenderness: There is no abdominal tenderness. There is no guarding.  Musculoskeletal:     Lumbar back: No swelling, deformity, signs of trauma or tenderness.     Comments: Small scar along lumbar spine  Skin:    General: Skin is warm and dry.     Coloration: Skin is pale.     Findings: No bruising, ecchymosis, signs of injury, lesion or petechiae.  Neurological:     Mental Status: He is alert.      Labs: CBC    Component Value Date/Time   WBC 5.1 12/02/2023 1302   RBC 3.27 (L) 12/02/2023 1302   RBC 3.21  (L) 12/02/2023 1302   HGB 7.0 (L) 12/02/2023 1302   HGB 14.4 03/10/2017 1026   HCT 25.1 (L) 12/02/2023 1302   HCT 42.8 03/10/2017 1026   PLT 289 12/02/2023 1302   PLT 188 03/10/2017 1026   MCV 76.8 (L) 12/02/2023 1302   MCV 81.6 02/18/2018 1312   MCV 84 03/10/2017 1026   MCH 21.4 (L) 12/02/2023 1302   MCHC 27.9 (L) 12/02/2023 1302   RDW 16.6 (H) 12/02/2023 1302   RDW 14.4 03/10/2017 1026   LYMPHSABS 1.1 12/30/2021 0938   LYMPHSABS 2.0 03/10/2017 1026   MONOABS 0.6 12/30/2021 0938   EOSABS 0.2 12/30/2021 0938   EOSABS 0.2 03/10/2017 1026   BASOSABS 0.1 12/30/2021 0938   BASOSABS 0.0 03/10/2017 1026     CMP     Component Value Date/Time  NA 138 12/02/2023 1302   K 3.8 12/02/2023 1302   CL 106 12/02/2023 1302   CO2 23 12/02/2023 1302   GLUCOSE 103 (H) 12/02/2023 1302   BUN 13 12/02/2023 1302   CREATININE 0.92 12/02/2023 1302   CALCIUM 9.0 12/02/2023 1302   PROT 6.9 12/02/2023 1302   ALBUMIN 4.0 12/02/2023 1302   AST 24 12/02/2023 1302   ALT 24 12/02/2023 1302   ALKPHOS 81 12/02/2023 1302   BILITOT 0.6 12/02/2023 1302   GFRNONAA >60 12/02/2023 1302    Imaging:  No results found.   EKG: personally reviewed my interpretation is sinus rhythm.   ASSESSMENT & PLAN:   Assessment & Plan by Problem: Principal Problem:   Acute blood loss anemia  Marcus Ball is a 53 y.o. 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 Hemoglobin on admit 7.0 (Baseline Hemoglobin 11). Patient denied any frank blood, melena, GERD or gastric ulcer symptoms. Patient also endorses Restless Leg symptoms. From ROS,  no clear GI bleed. Patient denies ROS for ddx of presyncopal symptoms such as palpitations, decreased oral intake/hypoglycemic episode, recent sickness (N,V, Diarrhea). Last Iron transfusion March 2025. GI consulted (Dr. Federico with [facility]) HHT Gastroenterologist: Darice Sharps (nurse) and Dr. Jillian (MD) at [facility]  -Possible EGD tomorrow  -NPO at midnight, Clear Liquids in meantime  -transfusing 1 U RBC 7/17 -Iron transfusion 7/18  -received pantoprazole  40 mg injection one time   Gout Last flare was 2-3 days ago -Allopurinol   -Naproxen  for flares    Best practice: Diet: clear liquid  VTE: SCDs IVF: 0.9% sodium chloride  infusion 10 ml/hr for one dose  Code: Full  Disposition planning: Prior to Admission Living Arrangement: Home Anticipated Discharge Location: Home  Dispo: Admit patient to Observation with expected length of stay less than 2 midnights.  Signed: Benuel Braun, DO Internal Medicine Resident  12/02/2023, 6:33 PM  Please contact IM Residency On-Call Pager at: [phone] or [phone].

## 2023-12-02 NOTE — ED Triage Notes (Signed)
 Patient has a blood disorder and was called and told his hgb is in 6.8.  Patient having dizziness but no shortness of breath.

## 2023-12-02 NOTE — ED Notes (Signed)
 Medications are being help until IV access is available.

## 2023-12-03 DIAGNOSIS — D62 Acute posthemorrhagic anemia: Secondary | ICD-10-CM | POA: Diagnosis not present

## 2023-12-03 DIAGNOSIS — I780 Hereditary hemorrhagic telangiectasia: Secondary | ICD-10-CM | POA: Diagnosis not present

## 2023-12-03 DIAGNOSIS — D649 Anemia, unspecified: Secondary | ICD-10-CM

## 2023-12-03 DIAGNOSIS — R195 Other fecal abnormalities: Secondary | ICD-10-CM

## 2023-12-03 LAB — COMPREHENSIVE METABOLIC PANEL WITH GFR
ALT: 23 U/L (ref 0–44)
AST: 23 U/L (ref 15–41)
Albumin: 3.4 g/dL — ABNORMAL LOW (ref 3.5–5.0)
Alkaline Phosphatase: 80 U/L (ref 38–126)
Anion gap: 9 (ref 5–15)
BUN: 11 mg/dL (ref 6–20)
CO2: 22 mmol/L (ref 22–32)
Calcium: 8.6 mg/dL — ABNORMAL LOW (ref 8.9–10.3)
Chloride: 109 mmol/L (ref 98–111)
Creatinine, Ser: 0.99 mg/dL (ref 0.61–1.24)
GFR, Estimated: >60 mL/min (ref >60)
Glucose, Bld: 99 mg/dL (ref 70–99)
Potassium: 4 mmol/L (ref 3.5–5.1)
Sodium: 140 mmol/L (ref 135–145)
Total Bilirubin: 0.7 mg/dL (ref 0.0–1.2)
Total Protein: 6.3 g/dL — ABNORMAL LOW (ref 6.5–8.1)

## 2023-12-03 LAB — CBC
HCT: 25.4 % — ABNORMAL LOW (ref 39.0–52.0)
Hemoglobin: 7.4 g/dL — ABNORMAL LOW (ref 13.0–17.0)
MCH: 22.4 pg — ABNORMAL LOW (ref 26.0–34.0)
MCHC: 29.1 g/dL — ABNORMAL LOW (ref 30.0–36.0)
MCV: 76.7 fL — ABNORMAL LOW (ref 80.0–100.0)
Platelets: 251 K/uL (ref 150–400)
RBC: 3.31 MIL/uL — ABNORMAL LOW (ref 4.22–5.81)
RDW: 17.1 % — ABNORMAL HIGH (ref 11.5–15.5)
WBC: 6 K/uL (ref 4.0–10.5)
nRBC: 0 % (ref 0.0–0.2)

## 2023-12-03 LAB — HEMOGLOBIN AND HEMATOCRIT, BLOOD
HCT: 31.1 % — ABNORMAL LOW (ref 39.0–52.0)
Hemoglobin: 9.5 g/dL — ABNORMAL LOW (ref 13.0–17.0)

## 2023-12-03 LAB — PREPARE RBC (CROSSMATCH)

## 2023-12-03 MED ORDER — SENNOSIDES-DOCUSATE SODIUM 8.6-50 MG PO TABS
2.0000 | ORAL_TABLET | Freq: Every day | ORAL | Status: DC
  Administered 2023-12-03: 2 via ORAL
  Filled 2023-12-03: qty 2

## 2023-12-03 MED ORDER — SODIUM CHLORIDE 0.9 % IV SOLN
25.0000 mg | Freq: Once | INTRAVENOUS | Status: AC
  Administered 2023-12-03: 25 mg via INTRAVENOUS
  Filled 2023-12-03: qty 0.5

## 2023-12-03 MED ORDER — SODIUM CHLORIDE 0.9% IV SOLUTION: Freq: Once | INTRAVENOUS | Status: AC

## 2023-12-03 MED ORDER — SODIUM CHLORIDE 0.9 % IV SOLN
1000.0000 mg | Freq: Once | INTRAVENOUS | Status: DC
  Filled 2023-12-03: qty 20

## 2023-12-03 MED ORDER — SODIUM CHLORIDE 0.9 % IV SOLN
1000.0000 mg | Freq: Once | INTRAVENOUS | Status: AC
  Administered 2023-12-03: 1000 mg via INTRAVENOUS
  Filled 2023-12-03 (×3): qty 20

## 2023-12-03 MED ORDER — ORAL CARE MOUTH RINSE
15.0000 mL | OROMUCOSAL | Status: DC | PRN
Start: 2023-12-03 — End: 2023-12-04

## 2023-12-03 MED ORDER — ACETAMINOPHEN 325 MG PO TABS
650.0000 mg | ORAL_TABLET | Freq: Once | ORAL | Status: AC
  Administered 2023-12-03: 650 mg via ORAL
  Filled 2023-12-03: qty 2

## 2023-12-03 MED ORDER — SODIUM CHLORIDE 0.9 % IV SOLN
500.0000 mg | Freq: Once | INTRAVENOUS | Status: DC
  Filled 2023-12-03: qty 25

## 2023-12-03 MED ORDER — SODIUM CHLORIDE 0.9% IV SOLUTION: Freq: Once | INTRAVENOUS | Status: DC

## 2023-12-03 MED ORDER — DIPHENHYDRAMINE HCL 25 MG PO CAPS
50.0000 mg | ORAL_CAPSULE | Freq: Once | ORAL | Status: AC
  Administered 2023-12-03: 50 mg via ORAL
  Filled 2023-12-03: qty 2

## 2023-12-03 NOTE — Progress Notes (Signed)
 HD#0 SUBJECTIVE:  Patient Summary: Marcus Ball is a 53 y.o. with a pertinent PMH of HHT, lumbar back pain, and gout with multiple flares a year, who presented from hematology clinic where his hemoglobin was said to be 6.8 and told told to come to ED for transfusion. On admit, his hemoglobin was 7 and he endorsed dizziness, light headedness, and weakness for 2 weeks and admitted for symptomatic anemia. .   Overnight Events: Patient received 1 U RBCs  Interim History: Patient seen with wife at bedside. Patient and wife had seen GI team prior to bedside rounds and they requested that information be relayed to HHT doctor prior to Iron or procedures.   OBJECTIVE:  Vital Signs: Vitals:   12/03/23 1150 12/03/23 1213 12/03/23 1346 12/03/23 1445  BP: 115/83 125/88 110/64 116/77  Pulse: 65 67 71 80  Resp: 18 18 17    Temp: 98.3 F (36.8 C) 97.8 F (36.6 C) 97.9 F (36.6 C) 97.7 F (36.5 C)  TempSrc:  Oral Oral Oral  SpO2: 100%  99% 97%  Weight:      Height:       Supplemental O2: Room Air SpO2: 97 %  Filed Weights   12/02/23 1242  Weight: 95.3 kg     Intake/Output Summary (Last 24 hours) at 12/03/2023 1553 Last data filed at 12/03/2023 1500 Gross per 24 hour  Intake 344.33 ml  Output --  Net 344.33 ml   Net IO Since Admission: 344.33 mL [12/03/23 1553]  Physical Exam: Physical Exam Constitutional:      Appearance: Normal appearance.  Cardiovascular:     Rate and Rhythm: Normal rate and regular rhythm.     Heart sounds: Normal heart sounds.  Pulmonary:     Effort: Pulmonary effort is normal.     Breath sounds: Normal breath sounds.  Abdominal:     General: Abdomen is flat.     Palpations: Abdomen is soft.  Skin:    General: Skin is warm and dry.  Neurological:     Mental Status: He is alert.     Patient Lines/Drains/Airways Status     Active Line/Drains/Airways     Name Placement date Placement time Site Days   Peripheral IV 12/02/23 20 G  Anterior;Distal;Left;Upper Arm 12/02/23  1840  Arm  1            Pertinent labs and imaging:      Latest Ref Rng & Units 12/03/2023    2:32 AM 12/02/2023    1:02 PM 12/30/2021    9:38 AM  CBC  WBC 4.0 - 10.5 K/uL 6.0  5.1  9.4   Hemoglobin 13.0 - 17.0 g/dL 7.4  7.0  87.9   Hematocrit 39.0 - 52.0 % 25.4  25.1  37.6   Platelets 150 - 400 K/uL 251  289  130.0        Latest Ref Rng & Units 12/03/2023    2:32 AM 12/02/2023    1:02 PM 11/27/2021   12:27 AM  CMP  Glucose 70 - 99 mg/dL 99  896  91   BUN 6 - 20 mg/dL 11  13  12    Creatinine 0.61 - 1.24 mg/dL 9.00  9.07  8.89   Sodium 135 - 145 mmol/L 140  138  141   Potassium 3.5 - 5.1 mmol/L 4.0  3.8  4.1   Chloride 98 - 111 mmol/L 109  106  109   CO2 22 - 32 mmol/L 22  23  23   Calcium 8.9 - 10.3 mg/dL 8.6  9.0  8.8   Total Protein 6.5 - 8.1 g/dL 6.3  6.9    Total Bilirubin 0.0 - 1.2 mg/dL 0.7  0.6    Alkaline Phos 38 - 126 U/L 80  81    AST 15 - 41 U/L 23  24    ALT 0 - 44 U/L 23  24      No results found.  ASSESSMENT/PLAN:  Assessment: Principal Problem:   Acute blood loss anemia   Plan: #HHT #Symptomatic Anemia  Hemoglobin 7.3 post 1 U RBCs -hemocult positive  -received 1 additional U RBCs  -iron dextran INFD (per request of HHT specialist and patient) 25 mg IV at 600 ml/hr followed by 1,000 mg IV at 104 ml/hr -EGD scheduled  7/19 (outpatient capsule if indicated). NPO midnight   Best Practice: Diet: regular diet, NPO at midnight   VTE: SCDs Start: 12/02/23 1811 Code: Full  Disposition planning: DISPO: Pending GI scope   Signature:  Sallyanne Benuel [facility]  3:53 PM, 12/03/2023  On Call pager [phone]

## 2023-12-03 NOTE — Consult Note (Addendum)
 Consultation  Referring Provider: Distant service/[facility] Primary Care Physician:  Patient, No Pcp Per Primary Gastroenterologist:  Dr.Cirigliano - hospital only  Reason for Consultation: Anemia, iron deficiency patient with HHT  HPI: Marcus Ball is a 53 y.o. male with diagnosis of HHT who had outpatient labs done yesterday due to complaints of recent weakness and dizziness over the past week or so.  His hemoglobin returned at 6.8 and he was directed to come to the hospital for transfusion.  Patient says that he has occasional small-volume epistaxis and has not had any large amounts of epistaxis over the past months.  He has not had any melena or hematochezia, and has not noted any black or dark stools.  No abdominal pain or discomfort, no recent nausea or vomiting. Review of his labs shows that hemoglobin as of 09/23/2023 was 13.1/hematocrit 38.2/MCV of 82.  On arrival here hemoglobin 7/hematocrit 25.1/MCV 76 Potassium 3.8 BUN 13/creatinine 0.92 LFTs normal B12 385 Ferritin 5/serum iron 11/TIBC 588/iron sat 2  He was transfused 1 unit of packed RBCs and follow-up hemoglobin this morning is 7.4.  Primary service has ordered IV iron however patient is reluctant to receive IV iron without the direction of his primary hematologist at [facility].  On interview today he asks that we call his hematologist Dr.Kasthuri at [facility] to discuss any workup to be done here. Patient is somewhat reluctant to undergo procedures.  He has not had any recent episodes of anemia requiring transfusions.  He does receive intermittent IV iron through [facility].  His most recent iron infusion apparently had been delayed and he is now scheduled to receive that in August.  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 Colonoscopy at that same  setting was unremarkable other than a few diverticuli. Has not had capsule endoscopy.  Appears he may have been taking naproxen  recently.   Past Medical History:  Diagnosis Date   HHT (hereditary hemorrhagic telangiectasia) (HCC)     Past Surgical History:  Procedure Laterality Date   BRAIN SURGERY     COLONOSCOPY WITH PROPOFOL  N/A 11/26/2021   Procedure: COLONOSCOPY WITH PROPOFOL ;  Surgeon: San Sandor GAILS, DO;  Location: [facility] ENDOSCOPY;  Service: Gastroenterology;  Laterality: N/A;   ESOPHAGOGASTRODUODENOSCOPY (EGD) WITH PROPOFOL  N/A 11/26/2021   Procedure: ESOPHAGOGASTRODUODENOSCOPY (EGD) WITH PROPOFOL ;  Surgeon: San Sandor GAILS, DO;  Location: [facility] ENDOSCOPY;  Service: Gastroenterology;  Laterality: N/A;   HEMOSTASIS CLIP PLACEMENT  11/26/2021   Procedure: HEMOSTASIS CLIP PLACEMENT;  Surgeon: San Sandor GAILS, DO;  Location: [facility] ENDOSCOPY;  Service: Gastroenterology;;   HOT HEMOSTASIS N/A 11/26/2021   Procedure: HOT HEMOSTASIS (ARGON PLASMA COAGULATION/BICAP);  Surgeon: San Sandor GAILS, DO;  Location: [facility] ENDOSCOPY;  Service: Gastroenterology;  Laterality: N/A;   LUNG SURGERY      Prior to Admission medications   Medication Sig Start Date End Date Taking? Authorizing Provider  acetaminophen -codeine  (TYLENOL  #3) 300-30 MG tablet Take 1 tablet by mouth 2 (two) times daily as needed for moderate pain (pain score 4-6). 08/09/23  Yes Barbarann Oneil BROCKS, MD  allopurinol  (ZYLOPRIM ) 300 MG tablet Take 1 tablet (300 mg total) by mouth daily. Patient taking differently: Take 300 mg by mouth every evening. 07/27/23  Yes Barbarann Oneil BROCKS, MD  azelastine  (ASTELIN ) 0.1 % nasal spray Place 2 sprays into  both nostrils 2 (two) times daily. Use in each nostril as directed Patient taking differently: Place 2 sprays into both nostrils 2 (two) times daily. 03/10/17   Juliane Che, PA  ferrous sulfate  325 (65 FE) MG tablet Take 1 tablet (325 mg total) by mouth 2 (two) times daily with a meal for 30 days,  THEN 1 tablet (325 mg total) daily with breakfast. 11/27/21 01/26/22  Tobie Yetta HERO, MD  methocarbamol  (ROBAXIN ) 500 MG tablet Take 1 tablet (500 mg total) by mouth every 8 (eight) hours as needed. Patient not taking: Reported on 12/02/2023 08/05/23   Vernetta Lonni GRADE, MD  methylPREDNISolone  (MEDROL  DOSEPAK) 4 MG TBPK tablet Take as prescribed on the box Patient not taking: Reported on 12/02/2023 10/27/23   Georgina Ozell LABOR, MD  naproxen  (NAPROSYN ) 500 MG tablet Take 1 tablet (500 mg total) by mouth 2 (two) times daily with a meal. Patient not taking: Reported on 12/02/2023 07/27/23   Barbarann Oneil BROCKS, MD  predniSONE  (STERAPRED UNI-PAK 21 TAB) 10 MG (21) TBPK tablet Take 6,5,4,3,2,1 one tablet less each day with food Patient not taking: Reported on 12/02/2023 08/05/23   Vernetta Lonni GRADE, MD    Current Facility-Administered Medications  Medication Dose Route Frequency Provider Last Rate Last Admin   acetaminophen  (TYLENOL ) tablet 650 mg  650 mg Oral Q6H PRN Elnora Ip, MD       Or   acetaminophen  (TYLENOL ) suppository 650 mg  650 mg Rectal Q6H PRN Elnora Ip, MD       senna-docusate (Senokot-S) tablet 2 tablet  2 tablet Oral QHS Rihner, Emilie, DO       Current Outpatient Medications  Medication Sig Dispense Refill   acetaminophen -codeine  (TYLENOL  #3) 300-30 MG tablet Take 1 tablet by mouth 2 (two) times daily as needed for moderate pain (pain score 4-6). 30 tablet 0   allopurinol  (ZYLOPRIM ) 300 MG tablet Take 1 tablet (300 mg total) by mouth daily. (Patient taking differently: Take 300 mg by mouth every evening.) 90 tablet 5   azelastine  (ASTELIN ) 0.1 % nasal spray Place 2 sprays into both nostrils 2 (two) times daily. Use in each nostril as directed (Patient taking differently: Place 2 sprays into both nostrils 2 (two) times daily.) 30 mL 0   ferrous sulfate  325 (65 FE) MG tablet Take 1 tablet (325 mg total) by mouth 2 (two) times daily with a meal for 30 days, THEN  1 tablet (325 mg total) daily with breakfast. 90 tablet 0   methocarbamol  (ROBAXIN ) 500 MG tablet Take 1 tablet (500 mg total) by mouth every 8 (eight) hours as needed. (Patient not taking: Reported on 12/02/2023) 40 tablet 1   methylPREDNISolone  (MEDROL  DOSEPAK) 4 MG TBPK tablet Take as prescribed on the box (Patient not taking: Reported on 12/02/2023) 21 tablet 0   naproxen  (NAPROSYN ) 500 MG tablet Take 1 tablet (500 mg total) by mouth 2 (two) times daily with a meal. (Patient not taking: Reported on 12/02/2023) 60 tablet 4   predniSONE  (STERAPRED UNI-PAK 21 TAB) 10 MG (21) TBPK tablet Take 6,5,4,3,2,1 one tablet less each day with food (Patient not taking: Reported on 12/02/2023) 21 tablet 0    Allergies as of 12/02/2023 - Review Complete 12/02/2023  Allergen Reaction Noted   Other Other (See Comments) 06/29/2011   Penicillins Hives 02/24/2012   Sulfa antibiotics Hives 02/24/2012    Family History  Problem Relation Age of Onset   Healthy Mother    Healthy Sister    Stroke Maternal Grandfather  Social History   Socioeconomic History   Marital status: Married    Spouse name: Not on file   Number of children: 3   Years of education: Master's Degree   Highest education level: Not on file  Occupational History   Occupation: Catering manager: [employer]  Tobacco Use   Smoking status: Never   Smokeless tobacco: Never  Vaping Use   Vaping status: Never Used  Substance and Sexual Activity   Alcohol use: No   Drug use: No   Sexual activity: Yes    Partners: Female  Other Topics Concern   Not on file  Social History Narrative   Lives with his wife and 2 children.   Oldest child is adult and lives independently.   Social Drivers of Corporate investment banker Strain: Not on file  Food Insecurity: Not on file  Transportation Needs: Not on file  Physical Activity: Not on file  Stress: Not on file  Social Connections: Not on file  Intimate  Partner Violence: Not on file    Review of Systems:Pertinent positive and negative review of systems were noted in the above HPI section.  All other review of systems was otherwise negative.    Physical Exam: Vital signs in last 24 hours: Temp:  [97.8 F (36.6 C)-98.6 F (37 C)] 98.6 F (37 C) (07/18 0244) Pulse Rate:  [58-86] 80 (07/18 0700) Resp:  [10-19] 14 (07/18 0700) BP: (90-142)/(67-91) 115/78 (07/18 0645) SpO2:  [98 %-100 %] 99 % (07/18 0700) Weight:  [95.3 kg] 95.3 kg (07/17 1242)   General:   Alert,  Well-developed, well-nourished, white male pleasant and cooperative in NAD, pale Head:  Normocephalic and atraumatic. Eyes:  Sclera clear, no icterus.   Conjunctiva pale Ears:  Normal auditory acuity. Nose:  No deformity, discharge,  or lesions. Mouth:  No deformity or lesions.   Neck:  Supple; no masses or thyromegaly. Lungs:  Clear throughout to auscultation.   No wheezes, crackles, or rhonchi.  Heart:  Regular rate and rhythm; no murmurs, clicks, rubs,  or gallops. Abdomen:  Soft,nontender, BS active,nonpalp mass or hsm.   Rectal: Brown heme positive stool Msk:  Symmetrical without gross deformities. . Pulses:  Normal pulses noted. Extremities:  Without clubbing or edema. Neurologic:  Alert and  oriented x4;  grossly normal neurologically. Skin:  Intact without significant lesions or rashes.. Psych:  Alert and cooperative. Normal mood and affect.  Intake/Output from previous day: 07/17 0701 - 07/18 0700 In: 340 [I.V.:40; Blood:300] Out: -  Intake/Output this shift: No intake/output data recorded.  Lab Results: Recent Labs    12/02/23 1302 12/03/23 0232  WBC 5.1 6.0  HGB 7.0* 7.4*  HCT 25.1* 25.4*  PLT 289 251   BMET Recent Labs    12/02/23 1302 12/03/23 0232  NA 138 140  K 3.8 4.0  CL 106 109  CO2 23 22  GLUCOSE 103* 99  BUN 13 11  CREATININE 0.92 0.99  CALCIUM 9.0 8.6*   LFT Recent Labs    12/03/23 0232  PROT 6.3*  ALBUMIN 3.4*  AST  23  ALT 23  ALKPHOS 80  BILITOT 0.7   PT/INR No results for input(s): LABPROT, INR in the last 72 hours. Hepatitis Panel No results for input(s): HEPBSAG, HCVAB, HEPAIGM, HEPBIGM in the last 72 hours.   IMPRESSION:  #71 53 year old white male with HHT who was directed to the emergency room after outpatient labs showed hemoglobin of 6.8 and  patient had recent complaint of weakness and lightheadedness.  He has had drop in hemoglobin from 13.1 on 09/23/2023 down to hemoglobin of 7 on arrival here Iron studies consistent with iron deficiency  He has not had any evidence of overt bleeding has not noted any dark stools/melena hematochezia etc.  No specific GI complaints  Does occasionally have low-grade epistaxis  Stool is brown and heme positive on exam today  Suspect that he has been oozing over the past couple of months perhaps intermittently. Patient has previously documented duodenal AVMs which were treated with APC in July 2023.  To date he has not had any colonic AVMs. He has not had capsule endoscopy to document extent if any of more distal small bowel AVMs.  Hemoglobin 7.4 post 1 unit of packed RBCs  Plan; okay for diet today Patient asks that we contact his hematologist Dr. [facility] at [facility] to discuss any further workup necessarily while he is inpatient.  He also wants the type of iron and iron infusions to be directed by his hematologist and declined the iron infusion that was brought into his room during our interview.  Proceed with 1 more unit of packed RBCs  Consider Octreotide LA - could be discussed with his hematologist  I have contacted [facility] benign hematology and will await return call from Dr. Kasthuri-patient has been having a slow GI blood loss, and if patient prefers to have further workup done at [facility] as an outpatient that is certainly reasonable.  Addendum-I did speak with Dr.Kathuri//[facility] hematology-he agreed with 1 more unit of packed RBCs, and  preference would be for iron dextran/INFeD rather than Venofer for iron infusion Specifically does not want patient to receive Injectafer  He agreed with pursuing enteroscopy while patient is admitted, and then capsule endoscopy could be done outpatient if indicated.        Masey Scheiber EsterwoodPA-C  12/03/2023, 9:43 AM

## 2023-12-03 NOTE — ED Notes (Signed)
 Notified floor of patient coming upstairs

## 2023-12-03 NOTE — H&P (Signed)
 Date: 12/02/2023               Patient Name:  Marcus Ball MRN: [id number]  DOB: 08-Jul-1970 Age / Sex: 53 y.o., male   PCP: Patient, No Pcp Per         Medical Service: Internal Medicine Teaching Service         Attending Physician: Dr. Dayton Eastern      First Contact: Sallyanne Primas, DO}    Second Contact: Dr. Hadassah Kristy Ahr, MD          Pager Information: First Contact Pager: [phone]   Second Contact Pager: [phone]   SUBJECTIVE   Chief Complaint: Dizziness, Lightheaded, and weakness   History of Present Illness: Marcus Ball is a 53 y.o. male with PMH of HHT (hereditary hemorrhagic telangectasia), lumbar back pain, and gout with multiple flares a year.   Patient presents to the ED from his hematology clinic where they noted his Hemoglobin to be 6.8 and he was told to come to the hospital for a transfusion. Patient noted onset of symptoms starting 1 to 2 weeks ago.  He noticed feeling lightheaded when bending over at first but the symptoms have progressed over the past week now including muscle cramps over the past 3-4 days located in his diaphragm and legs. He said these are his general symptoms when he has been anemic in the past and needed blood. He notes the cramping in his legs occurs at night and is generally resolved when he gets up and moves around a little. Last medical procedure was for back pain where he received lumbar injection (7/3) with steroid and was taking 1 week of steroid oral outpatient. Patient denies any new medications. Patient notes last gout flare was 2-3 days ago where he took naproxen  twice daily for those three days; the only other medications he notes taking is tylenol  for a couple of days 2 weeks ago.   Patient's last iron infusion was March and he was scheduled for another infusion next week.   Patient endorses: HA last week (patient was at beach with family and notes probably from being in the sun), muscle cramping (diaphragm,  legs), weakness, dizziness, lightheadedness   Patient denies: Blood in stool, black/tarry stool, blood in urine, recent strenuous exercise, GERD symptoms, CP, SOB, Dyspnea, palpitations, Nausea, Vomiting, diarrhea, new bruising, pain in extremities, ibuprofen/Goody Powder use, abdominal pain, new onset severe HA  ED Course: Labs significant for:  Hgb 7.0 HCT 25.1 MCV 27.8 Platelets 289 BUN 13 Glucose 103 Iron 11 Ferritin 5 TIBC 584 Saturation Ratio 2 Reticulocyte Ct, absolute 70 B12 385 Folate 16.5 Type and Screen A+  Received:  Pantoprazole  40 mg injection once 0.9% sodium chloride  infusion 10 ml/hr once for 1 dose for blood transfusion  Acetaminophen  suppository  Transfuse 1 Unit RBC starting at 1716 12/02/2023  Consulted: GI  Meds:  Patient reported:  Allopurinol  for gout  naproxen  for gout flares   No outpatient medications have been marked as taking for the 12/02/23 encounter [facility] Encounter).    Past Medical History  Patient's was diagnosed with HHT after imaging received when having back surgery in 2005. 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   Last medical procedure was for back pain where he received lumbar injection (7/3) with steroid and was taking 1 week of steroid oral outpatient  Patient notes gout flares (2 per year)  Past Surgical History Past Surgical History:  Procedure Laterality  Date   BRAIN SURGERY     COLONOSCOPY WITH PROPOFOL  N/A 11/26/2021   Procedure: COLONOSCOPY WITH PROPOFOL ;  Surgeon: San Sandor GAILS, DO;  Location: [facility] ENDOSCOPY;  Service: Gastroenterology;  Laterality: N/A;   ESOPHAGOGASTRODUODENOSCOPY (EGD) WITH PROPOFOL  N/A 11/26/2021   Procedure: ESOPHAGOGASTRODUODENOSCOPY (EGD) WITH PROPOFOL ;  Surgeon: San Sandor GAILS, DO;  Location: [facility] ENDOSCOPY;  Service: Gastroenterology;  Laterality: N/A;   HEMOSTASIS CLIP PLACEMENT  11/26/2021   Procedure: HEMOSTASIS CLIP PLACEMENT;  Surgeon: San Sandor GAILS, DO;  Location: [facility] ENDOSCOPY;  Service: Gastroenterology;;   HOT HEMOSTASIS N/A 11/26/2021   Procedure: HOT HEMOSTASIS (ARGON PLASMA COAGULATION/BICAP);  Surgeon: San Sandor GAILS, DO;  Location: [facility] ENDOSCOPY;  Service: Gastroenterology;  Laterality: N/A;   LUNG SURGERY       Social:  Lives With: wife Occupation: Support: PCP: Patient, No Pcp Per  Substances: -Tobacco: Marcus Ball -Alcohol: occasional  -Recreational Drug: Marcus Ball  Family History:  Family History  Problem Relation Age of Onset   Healthy Mother    Healthy Sister    Stroke Maternal Grandfather      Allergies: Allergies as of 12/02/2023 - Review Complete 12/02/2023  Allergen Reaction Noted   Other Other (See Comments) 06/29/2011   Penicillins Hives 02/24/2012   Sulfa antibiotics Hives 02/24/2012    Review of Systems: A complete ROS was negative except as per HPI.   OBJECTIVE:   Physical Exam: Blood pressure 136/83, pulse 70, temperature 98 F (36.7 C), resp. rate 18, height 6' (1.829 m), weight 95.3 kg, SpO2 100%.   Physical Exam Constitutional:      Appearance: He is well-developed.  Cardiovascular:     Rate and Rhythm: Normal rate and regular rhythm.     Heart sounds: Normal heart sounds.  Pulmonary:     Effort: Pulmonary effort is normal.     Breath sounds: Normal breath sounds.  Abdominal:     General: Abdomen is flat. Bowel sounds are normal.     Palpations: Abdomen is soft.     Tenderness: There is no abdominal tenderness. There is no guarding.  Musculoskeletal:     Lumbar back: No swelling, deformity, signs of trauma or tenderness.     Comments: Small scar along lumbar spine  Skin:    General: Skin is warm and dry.     Coloration: Skin is pale.     Findings: No bruising, ecchymosis, signs of injury, lesion or petechiae.  Neurological:     Mental Status: He is alert.      Labs: CBC    Component Value Date/Time   WBC 5.1 12/02/2023 1302   RBC 3.27 (L) 12/02/2023 1302   RBC 3.21  (L) 12/02/2023 1302   HGB 7.0 (L) 12/02/2023 1302   HGB 14.4 03/10/2017 1026   HCT 25.1 (L) 12/02/2023 1302   HCT 42.8 03/10/2017 1026   PLT 289 12/02/2023 1302   PLT 188 03/10/2017 1026   MCV 76.8 (L) 12/02/2023 1302   MCV 81.6 02/18/2018 1312   MCV 84 03/10/2017 1026   MCH 21.4 (L) 12/02/2023 1302   MCHC 27.9 (L) 12/02/2023 1302   RDW 16.6 (H) 12/02/2023 1302   RDW 14.4 03/10/2017 1026   LYMPHSABS 1.1 12/30/2021 0938   LYMPHSABS 2.0 03/10/2017 1026   MONOABS 0.6 12/30/2021 0938   EOSABS 0.2 12/30/2021 0938   EOSABS 0.2 03/10/2017 1026   BASOSABS 0.1 12/30/2021 0938   BASOSABS 0.0 03/10/2017 1026     CMP     Component Value Date/Time  NA 138 12/02/2023 1302   K 3.8 12/02/2023 1302   CL 106 12/02/2023 1302   CO2 23 12/02/2023 1302   GLUCOSE 103 (H) 12/02/2023 1302   BUN 13 12/02/2023 1302   CREATININE 0.92 12/02/2023 1302   CALCIUM 9.0 12/02/2023 1302   PROT 6.9 12/02/2023 1302   ALBUMIN 4.0 12/02/2023 1302   AST 24 12/02/2023 1302   ALT 24 12/02/2023 1302   ALKPHOS 81 12/02/2023 1302   BILITOT 0.6 12/02/2023 1302   GFRNONAA >60 12/02/2023 1302    Imaging:  No results found.   EKG: personally reviewed my interpretation is sinus rhythm.   ASSESSMENT & PLAN:   Assessment & Plan by Problem: Principal Problem:   Acute blood loss anemia  Marcus Ball is a 53 y.o. 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 Hemoglobin on admit 7.0 (Baseline Hemoglobin 11). Patient denied any frank blood, melena, GERD or gastric ulcer symptoms. Patient also endorses Restless Leg symptoms. From ROS,  no clear GI bleed. Patient denies ROS for ddx of presyncopal symptoms such as palpitations, decreased oral intake/hypoglycemic episode, recent sickness (N,V, Diarrhea). Last Iron transfusion March 2025. GI consulted (Dr. Federico with [facility]) HHT Gastroenterologist: Darice Sharps (nurse) and Dr. Jillian (MD) at [facility]  -Possible EGD tomorrow  -NPO at midnight, Clear Liquids in meantime  -transfusing 1 U RBC 7/17 -Iron transfusion 7/18  -received pantoprazole  40 mg injection one time   Gout Last flare was 2-3 days ago -Allopurinol   -Naproxen  for flares    Best practice: Diet: clear liquid  VTE: SCDs IVF: 0.9% sodium chloride  infusion 10 ml/hr for one dose  Code: Full  Disposition planning: Prior to Admission Living Arrangement: Home Anticipated Discharge Location: Home  Dispo: Admit patient to Observation with expected length of stay less than 2 midnights.  Signed: Benuel Braun, DO Internal Medicine Resident  12/02/2023, 6:33 PM  Please contact IM Residency On-Call Pager at: [phone] or [phone].

## 2023-12-04 ENCOUNTER — Encounter (HOSPITAL_COMMUNITY)
Admission: EM | Disposition: A | Discharge status: Home / Self Care | Payer: Self-pay | Source: Ambulatory Visit | Attending: Emergency Medicine

## 2023-12-04 ENCOUNTER — Observation Stay (HOSPITAL_BASED_OUTPATIENT_CLINIC_OR_DEPARTMENT_OTHER): Payer: Self-pay | Admitting: Certified Registered"

## 2023-12-04 ENCOUNTER — Encounter (HOSPITAL_COMMUNITY): Payer: Self-pay | Admitting: Internal Medicine

## 2023-12-04 ENCOUNTER — Observation Stay (HOSPITAL_COMMUNITY): Payer: Self-pay | Admitting: Certified Registered"

## 2023-12-04 DIAGNOSIS — K259 Gastric ulcer, unspecified as acute or chronic, without hemorrhage or perforation: Secondary | ICD-10-CM

## 2023-12-04 DIAGNOSIS — R195 Other fecal abnormalities: Secondary | ICD-10-CM | POA: Diagnosis not present

## 2023-12-04 DIAGNOSIS — K31819 Angiodysplasia of stomach and duodenum without bleeding: Secondary | ICD-10-CM

## 2023-12-04 DIAGNOSIS — D509 Iron deficiency anemia, unspecified: Secondary | ICD-10-CM | POA: Diagnosis not present

## 2023-12-04 HISTORY — PX: HOT HEMOSTASIS: SHX5433

## 2023-12-04 HISTORY — PX: HEMOSTASIS CLIP PLACEMENT: SHX6857

## 2023-12-04 HISTORY — PX: ENTEROSCOPY: SHX5533

## 2023-12-04 LAB — BASIC METABOLIC PANEL WITH GFR
Anion gap: 7 (ref 5–15)
BUN: 15 mg/dL (ref 6–20)
CO2: 23 mmol/L (ref 22–32)
Calcium: 8.7 mg/dL — ABNORMAL LOW (ref 8.9–10.3)
Chloride: 110 mmol/L (ref 98–111)
Creatinine, Ser: 1.03 mg/dL (ref 0.61–1.24)
GFR, Estimated: >60 mL/min (ref >60)
Glucose, Bld: 107 mg/dL — ABNORMAL HIGH (ref 70–99)
Potassium: 4 mmol/L (ref 3.5–5.1)
Sodium: 140 mmol/L (ref 135–145)

## 2023-12-04 LAB — CBC
HCT: 28.8 % — ABNORMAL LOW (ref 39.0–52.0)
HCT: 33 % — ABNORMAL LOW (ref 39.0–52.0)
Hemoglobin: 8.8 g/dL — ABNORMAL LOW (ref 13.0–17.0)
Hemoglobin: 10 g/dL — ABNORMAL LOW (ref 13.0–17.0)
MCH: 23.6 pg — ABNORMAL LOW (ref 26.0–34.0)
MCH: 23.4 pg — ABNORMAL LOW (ref 26.0–34.0)
MCHC: 30.6 g/dL (ref 30.0–36.0)
MCHC: 30.3 g/dL (ref 30.0–36.0)
MCV: 77.2 fL — ABNORMAL LOW (ref 80.0–100.0)
MCV: 77.1 fL — ABNORMAL LOW (ref 80.0–100.0)
Platelets: 244 K/uL (ref 150–400)
Platelets: 258 K/uL (ref 150–400)
RBC: 3.73 MIL/uL — ABNORMAL LOW (ref 4.22–5.81)
RBC: 4.28 MIL/uL (ref 4.22–5.81)
RDW: 17.2 % — ABNORMAL HIGH (ref 11.5–15.5)
RDW: 17.6 % — ABNORMAL HIGH (ref 11.5–15.5)
WBC: 7 K/uL (ref 4.0–10.5)
WBC: 5.9 K/uL (ref 4.0–10.5)
nRBC: 0.3 % — ABNORMAL HIGH (ref 0.0–0.2)
nRBC: 0.3 % — ABNORMAL HIGH (ref 0.0–0.2)

## 2023-12-04 SURGERY — ENTEROSCOPY
Anesthesia: Monitor Anesthesia Care

## 2023-12-04 MED ORDER — GLYCOPYRROLATE PF 0.2 MG/ML IJ SOSY
PREFILLED_SYRINGE | INTRAMUSCULAR | Status: DC | PRN
  Administered 2023-12-04: .1 mg via INTRAVENOUS

## 2023-12-04 MED ORDER — SODIUM CHLORIDE 0.9 % IV SOLN: INTRAVENOUS | Status: DC | PRN

## 2023-12-04 MED ORDER — PHENYLEPHRINE 80 MCG/ML (10ML) SYRINGE FOR IV PUSH (FOR BLOOD PRESSURE SUPPORT)
PREFILLED_SYRINGE | INTRAVENOUS | Status: DC | PRN
  Administered 2023-12-04: 80 ug via INTRAVENOUS

## 2023-12-04 MED ORDER — PROPOFOL 10 MG/ML IV BOLUS
INTRAVENOUS | Status: DC | PRN
  Administered 2023-12-04 (×2): 30 mg via INTRAVENOUS
  Administered 2023-12-04: 20 mg via INTRAVENOUS
  Administered 2023-12-04: 100 mg via INTRAVENOUS

## 2023-12-04 MED ORDER — LIDOCAINE 2% (20 MG/ML) 5 ML SYRINGE
INTRAMUSCULAR | Status: DC | PRN
  Administered 2023-12-04: 100 mg via INTRAVENOUS

## 2023-12-04 MED ORDER — PROPOFOL 500 MG/50ML IV EMUL
INTRAVENOUS | Status: DC | PRN
  Administered 2023-12-04: 150 ug/kg/min via INTRAVENOUS

## 2023-12-04 MED ORDER — EPHEDRINE SULFATE (PRESSORS) 50 MG/ML IJ SOLN
INTRAMUSCULAR | Status: DC | PRN
Start: 2023-12-04 — End: 2023-12-04
  Administered 2023-12-04: 5 mg via INTRAVENOUS

## 2023-12-04 NOTE — Transfer of Care (Signed)
 Immediate Anesthesia Transfer of Care Note  Patient: Marcus Ball  Procedure(s) Performed: ENTEROSCOPY EGD, WITH ARGON PLASMA COAGULATION CONTROL OF HEMORRHAGE, GI TRACT, ENDOSCOPIC, BY CLIPPING OR OVERSEWING  Patient Location: PACU  Anesthesia Type:MAC  Level of Consciousness: drowsy and responds to stimulation  Airway & Oxygen Therapy: Patient Spontanous Breathing and Patient connected to face mask oxygen  Post-op Assessment: Report given to RN and Post -op Vital signs reviewed and stable  Post vital signs: Reviewed and stable  Last Vitals:  Vitals Value Taken Time  BP 105/77 12/04/23 08:34  Temp 36.5 C 12/04/23 08:30  Pulse 80 12/04/23 08:36  Resp 15 12/04/23 08:36  SpO2 99 % 12/04/23 08:36  Vitals shown include unfiled device data.  Last Pain:  Vitals:   12/04/23 0716  TempSrc: Temporal  PainSc: 0-No pain         Complications: No notable events documented.

## 2023-12-04 NOTE — Discharge Summary (Signed)
 Name: Marcus Ball MRN: [id number] DOB: 1970/12/05 53 y.o. PCP: Patient, No Pcp Per  Date of Admission: 12/02/2023 12:37 PM Date of Discharge: 12/04/2023 Attending Physician: Dr. Mliss Foot  Discharge Diagnosis: 1. Principal Problem:   Acute blood loss anemia Active Problems:   HHT (hereditary hemorrhagic telangiectasia) (HCC)   Symptomatic anemia   Iron  deficiency anemia   Angiectasia of gastrointestinal tract   Discharge Medications: Allergies as of 12/04/2023       Reactions   Other Other (See Comments)   **Cannot take blood thinners(HHT) - causes anemia   Penicillins Hives   Hives in HS, but takes Amoxicillin before dental procedures without reaction   Sulfa Antibiotics Hives        Medication List     STOP taking these medications    acetaminophen -codeine  300-30 MG tablet Commonly known as: TYLENOL  #3   azelastine  0.1 % nasal spray Commonly known as: ASTELIN    methocarbamol  500 MG tablet Commonly known as: ROBAXIN    methylPREDNISolone  4 MG Tbpk tablet Commonly known as: MEDROL  DOSEPAK   naproxen  500 MG tablet Commonly known as: NAPROSYN    predniSONE  10 MG (21) Tbpk tablet Commonly known as: STERAPRED UNI-PAK 21 TAB       TAKE these medications    allopurinol  300 MG tablet Commonly known as: ZYLOPRIM  Take 1 tablet (300 mg total) by mouth daily. What changed: when to take this   ferrous sulfate  325 (65 FE) MG tablet Take 1 tablet (325 mg total) by mouth 2 (two) times daily with a meal for 30 days, THEN 1 tablet (325 mg total) daily with breakfast. Start taking on: November 27, 2021        Disposition and follow-up:   Marcus Ball was discharged from [facility] in Stable condition.  At the hospital follow up visit please address:  HHT and iron  deficiency  - Follow up at HHT clinic in the next 14 days  - Have a repeat cbc in 7-10 days  - Continue oral iron   - Follow up iron  studies at HHT clinic  -  Discuss the potential for long acting octreotide for   2.  Labs / imaging needed at time of follow-up: Hgb, iron  studies  3.  Pending labs/ test needing follow-up: Enteroscopy biopsy results  Follow-up Appointments: Patient to reach out to Gastroenterologist at [facility] or [facility] and to follow up with his HHT specialist  Hospital Course by problem list: Marcus Ball is a 53 y.o. with a pertinent PMH of HHT with prior duodenal angioectasias and followed at [facility] HHT clinic who presented with symptomatic anemia and admitted for symptomatic acute on chronic anemia and severe iron  deficiency anemia s/p pRBC transfusion and iron  infusion s/p small bowel enteroscopy  now being discharged on hospital day 2 with the following pertinent hospital course:  Acute on chronic anemia Hereditary hemorrhagic telangiectasia Severe iron  deficiency anemia FOBT+ on admission performed by GI. S/p 2 units of pRBC and 1x 1000 mg iron  dextran. Hgb post transfusion 9.5>8.8. [facility] team communicated with [facility] Jannis at [facility]. Patient underwent small bowel enteroscopy with Dr. Suzann. There was a 4mm gastric erosion without bleeding stigmata, 6 angiectasias in the duodenum, 6 angiectasias. Patient discharge Hgb 10. 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 Gastric biopsies for H pylori will need follow up.  Gout Continued allopurinol . He has had 2-3 flares in  the past 4 months. Admits to not being adherent to Allopurinol  and has taken Naproxen  ~3 days/per flare. Advised to avoid using NSAIDs.  Pertinent Labs, Studies, and Procedures:     Latest Ref Rng & Units 12/04/2023    2:17 PM 12/04/2023    4:03 AM 12/03/2023    7:27 PM  CBC  WBC 4.0 - 10.5 K/uL 5.9  7.0    Hemoglobin 13.0 - 17.0 g/dL 89.9  8.8  9.5   Hematocrit 39.0 - 52.0 % 33.0  28.8  31.1   Platelets 150 - 400 K/uL 258  244         Latest Ref Rng & Units  12/04/2023    4:03 AM 12/03/2023    2:32 AM 12/02/2023    1:02 PM  CMP  Glucose 70 - 99 mg/dL 892  99  896   BUN 6 - 20 mg/dL 15  11  13    Creatinine 0.61 - 1.24 mg/dL 8.96  9.00  9.07   Sodium 135 - 145 mmol/L 140  140  138   Potassium 3.5 - 5.1 mmol/L 4.0  4.0  3.8   Chloride 98 - 111 mmol/L 110  109  106   CO2 22 - 32 mmol/L 23  22  23    Calcium 8.9 - 10.3 mg/dL 8.7  8.6  9.0   Total Protein 6.5 - 8.1 g/dL  6.3  6.9   Total Bilirubin 0.0 - 1.2 mg/dL  0.7  0.6   Alkaline Phos 38 - 126 U/L  80  81   AST 15 - 41 U/L  23  24   ALT 0 - 44 U/L  23  24     No results found.   Discharge Instructions: Discharge Instructions     Call MD for:  difficulty breathing, headache or visual disturbances   Complete by: As directed    Call MD for:  extreme fatigue   Complete by: As directed    Call MD for:  persistant dizziness or light-headedness   Complete by: As directed    Call MD for:  persistant nausea and vomiting   Complete by: As directed    Call MD for:  redness, tenderness, or signs of infection (pain, swelling, redness, odor or green/yellow discharge around incision site)   Complete by: As directed    Call MD for:  severe uncontrolled pain   Complete by: As directed    Diet - low sodium heart healthy   Complete by: As directed    Discharge instructions   Complete by: As directed    Thank you for allowing us  to be part of your care. You were hospitalized for abnormal lab values and symptomatic anemia. While you were hospitalized, our Gastroenterology team was in touch with your doctor at the HHT clinic. With their support, you were given 2 units of blood, 1000 mg of IV iron  Dextran. 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 Post procedure, your Hgb was 10.   You are now stable for a safe discharge home with the following recommendations:  *For your hx of HHT and iron   deficiency  - Follow up at HHT clinic in the next 14 days  - Have a repeat cbc in 7-10 days  - Continue oral iron   - Follow up iron  studies at HHT clinic  - Discuss the potential for long acting octreotide with  your HHT doctor  It is important that you AVOID all NSAIDS, including Naproxen , to avoid further erosions in your stomach. Continue taking your allopurinol  daily to help reduce the number of gout flares that lead you take Naproxen  for relief.   If you do have a gout flare, have your primary care doctor order prednisone  or do a steroid injection if it is only one joint  Have your primary care doctor follow up with the results of your endoscopy biopsy regarding H pylori (bacteria) testing.    FOLLOW UP APPOINTMENTS: Follow up with your family doctor  in 7-14 days  If you decide to follow up with [facility], with Dr. Stacia, make an appointment at [phone]. Let them know that you need a hospital follow up appointment.  However, if you would like to consolidate your care at [facility], please request an appointment with their group.  It was a pleasure taking care of you!  Please call your PCP or our clinic if you have any questions or concerns, we may be able to help and keep you from a long and expensive emergency room wait. Our clinic and after hours phone number is [phone]. The best time to call is Monday through Friday 9 am to 4 pm but there is always someone available 24/7 if you have an emergency. If you need medication refills please notify your pharmacy one week in advance and they will send us  a request.   We are glad you are feeling better,  Marcus Ball Ala Internal Medicine Inpatient Teaching Service at [facility]   Increase activity slowly   Complete by: As directed        Signed: Ala Hadassah, MD 12/04/2023, 3:31 PM

## 2023-12-04 NOTE — Progress Notes (Addendum)
 HD#0 SUBJECTIVE:  Patient Summary: Marcus Ball is a 53 y.o. with a pertinent PMH of HHT with prior duodenal angioectasias and followed at [facility] HHT clinic who presented with symptomatic anemia and admitted for symptomatic acute on chronic anemia and severe iron  deficiency anemia s/p pRBC transfusion and iron  infusion currently awaiting for enteroscopy with [facility]  Overnight Events: None  Interim History: Seen at endoscopy suite. No major events.   OBJECTIVE:  Vital Signs: Vitals:   12/03/23 1445 12/03/23 1733 12/03/23 2031 12/04/23 0555  BP: 116/77 100/87 114/79 118/81  Pulse: 80 73 76 61  Resp:   16   Temp: 97.7 F (36.5 C) 97.9 F (36.6 C) 98.7 F (37.1 C) 98.1 F (36.7 C)  TempSrc: Oral Oral Oral Oral  SpO2: 97% 97% 96% 96%  Weight:      Height:       Supplemental O2: Room Air SpO2: 96 %  Filed Weights   12/02/23 1242  Weight: 95.3 kg     Intake/Output Summary (Last 24 hours) at 12/04/2023 0637 Last data filed at 12/04/2023 0400 Gross per 24 hour  Intake 1379.38 ml  Output 2 ml  Net 1377.38 ml   Net IO Since Admission: 1,717.38 mL [12/04/23 0637]  Physical Exam: General: No acute distress. CV: RRR. No murmurs Pulmonary: Lungs CTAB. Normal effort. Abdominal: Soft, nontender, nondistended Extremities and skin: Warm and dry. No obvious rash or lesions. Neuro: alert and O x3 Psych: Normal mood and affect   Patient Lines/Drains/Airways Status     Active Line/Drains/Airways     Name Placement date Placement time Site Days   Peripheral IV 12/02/23 20 G Anterior;Distal;Left;Upper Arm 12/02/23  1840  Arm  2            Pertinent labs and imaging:      Latest Ref Rng & Units 12/04/2023    4:03 AM 12/03/2023    7:27 PM 12/03/2023    2:32 AM  CBC  WBC 4.0 - 10.5 K/uL 7.0   6.0   Hemoglobin 13.0 - 17.0 g/dL 8.8  9.5  7.4   Hematocrit 39.0 - 52.0 % 28.8  31.1  25.4   Platelets 150 - 400 K/uL 244   251        Latest Ref Rng & Units  12/04/2023    4:03 AM 12/03/2023    2:32 AM 12/02/2023    1:02 PM  CMP  Glucose 70 - 99 mg/dL 892  99  896   BUN 6 - 20 mg/dL 15  11  13    Creatinine 0.61 - 1.24 mg/dL 8.96  9.00  9.07   Sodium 135 - 145 mmol/L 140  140  138   Potassium 3.5 - 5.1 mmol/L 4.0  4.0  3.8   Chloride 98 - 111 mmol/L 110  109  106   CO2 22 - 32 mmol/L 23  22  23    Calcium 8.9 - 10.3 mg/dL 8.7  8.6  9.0   Total Protein 6.5 - 8.1 g/dL  6.3  6.9   Total Bilirubin 0.0 - 1.2 mg/dL  0.7  0.6   Alkaline Phos 38 - 126 U/L  80  81   AST 15 - 41 U/L  23  24   ALT 0 - 44 U/L  23  24     No results found.  ASSESSMENT/PLAN:  Assessment: Principal Problem:   Acute blood loss anemia Active Problems:   HHT (hereditary hemorrhagic telangiectasia) (HCC)  Angiectasia of gastrointestinal tract   Plan:  Acute on chronic anemia Hereditary hemorrhagic telangiectasia Severe iron  deficiency anemia FOBT+ on admission performed by GI. S/p 2 units of pRBC and 1x 1000 mg iron  dextran. Hgb post transfusion 9.5>8.8. [facility] team communicated with Dr. Jannis at [facility]. Patient will undergo enteroscopy for AVM surveillance. - NPO for enteroscopy this AM - Capsule endoscopy at [facility] - Will monitor patient and follow up GI recommendations post enteroscopy  Gout Stable - Continued allopurinol   Best Practice: Diet: Regular diet > NPO VTE: SCDs Start: 12/02/23 1811 Code: Full  Disposition planning: Family Contact: wife, at bedside. DISPO: Anticipated discharge tomorrow to Home pending enteroscopy and stable hemoglobin.  Signature:  Hadassah Elnora Jolynn Davene Internal Medicine Residency  6:37 AM, 12/04/2023  On Call pager [phone]

## 2023-12-04 NOTE — Plan of Care (Signed)

## 2023-12-04 NOTE — Progress Notes (Signed)
 0720 patient going to have EGD transported via [facility] on room air alert and orientated

## 2023-12-04 NOTE — H&P (Signed)
 [facility] History and Physical   Primary Care Physician:  Patient, No Pcp Per   Reason for Procedure:  Symptomatic iron  deficiency anemia, patient with HHT and history of small bowel angioectasias  Plan:    Small bowel enteroscopy     HPI: Marcus Ball is a 53 y.o. male undergoing small bowel enteroscopy for symptomatic iron  deficiency anemia in a patient with HHT and history of small bowel angioectasias.  Patient was admitted to the hospital with weakness and hemoglobin 6.8.  No overt melena or hematochezia. Known history of HHT.  During hospitalization July 2023 with EGD showed a 2 cm hiatal hernia normal-appearing stomach and 11 AVMs were seen in the duodenum all treated with APC, 1 of these had some oozing after APC and this was treated with a clip. Colonoscopy at that same setting was unremarkable other than a few diverticuli. Has not had capsule endoscopy.  Hemoglobin status post transfusion is 8.5.  No anticoagulants.  Past Medical History:  Diagnosis Date   HHT (hereditary hemorrhagic telangiectasia) (HCC)     Past Surgical History:  Procedure Laterality Date   BRAIN SURGERY     COLONOSCOPY WITH PROPOFOL  N/A 11/26/2021   Procedure: COLONOSCOPY WITH PROPOFOL ;  Surgeon: San Sandor GAILS, DO;  Location: [facility];  Service: Gastroenterology;  Laterality: N/A;   ESOPHAGOGASTRODUODENOSCOPY (EGD) WITH PROPOFOL  N/A 11/26/2021   Procedure: ESOPHAGOGASTRODUODENOSCOPY (EGD) WITH PROPOFOL ;  Surgeon: San Sandor GAILS, DO;  Location: [facility]OSCOPY;  Service: Gastroenterology;  Laterality: N/A;   HEMOSTASIS CLIP PLACEMENT  11/26/2021   Procedure: HEMOSTASIS CLIP PLACEMENT;  Surgeon: San Sandor GAILS, DO;  Location: [facility]OSCOPY;  Service: Gastroenterology;;   HOT HEMOSTASIS N/A 11/26/2021   Procedure: HOT HEMOSTASIS (ARGON PLASMA COAGULATION/BICAP);  Surgeon: San Sandor GAILS, DO;  Location: [facility] ENDOSCOPY;  Service: Gastroenterology;  Laterality: N/A;    LUNG SURGERY      Prior to Admission medications   Medication Sig Start Date End Date Taking? Authorizing Provider  acetaminophen -codeine  (TYLENOL  #3) 300-30 MG tablet Take 1 tablet by mouth 2 (two) times daily as needed for moderate pain (pain score 4-6). 08/09/23  Yes Barbarann Oneil BROCKS, MD  allopurinol  (ZYLOPRIM ) 300 MG tablet Take 1 tablet (300 mg total) by mouth daily. Patient taking differently: Take 300 mg by mouth every evening. 07/27/23  Yes Barbarann Oneil BROCKS, MD  azelastine  (ASTELIN ) 0.1 % nasal spray Place 2 sprays into both nostrils 2 (two) times daily. Use in each nostril as directed Patient taking differently: Place 2 sprays into both nostrils 2 (two) times daily. 03/10/17   Juliane Che, PA  ferrous sulfate  325 (65 FE) MG tablet Take 1 tablet (325 mg total) by mouth 2 (two) times daily with a meal for 30 days, THEN 1 tablet (325 mg total) daily with breakfast. 11/27/21 01/26/22  Tobie Yetta CHRISTELLA, MD  methocarbamol  (ROBAXIN ) 500 MG tablet Take 1 tablet (500 mg total) by mouth every 8 (eight) hours as needed. Patient not taking: Reported on 12/02/2023 08/05/23   Vernetta Lonni GRADE, MD  methylPREDNISolone  (MEDROL  DOSEPAK) 4 MG TBPK tablet Take as prescribed on the box Patient not taking: Reported on 12/02/2023 10/27/23   Georgina Ozell LABOR, MD  naproxen  (NAPROSYN ) 500 MG tablet Take 1 tablet (500 mg total) by mouth 2 (two) times daily with a meal. Patient not taking: Reported on 12/02/2023 07/27/23   Barbarann Oneil BROCKS, MD  predniSONE  (STERAPRED UNI-PAK 21 TAB) 10 MG (21) TBPK tablet Take 6,5,4,3,2,1 one tablet less each day with food Patient  not taking: Reported on 12/02/2023 08/05/23   Vernetta Lonni GRADE, MD    Current Facility-Administered Medications  Medication Dose Route Frequency Provider Last Rate Last Admin   0.9 %  sodium chloride  infusion (Manually program via Guardrails IV Fluids)   Intravenous Once Rihner, Emilie, DO       acetaminophen  (TYLENOL ) tablet 650 mg  650 mg Oral Q6H PRN  Elnora Ip, MD       Or   acetaminophen  (TYLENOL ) suppository 650 mg  650 mg Rectal Q6H PRN Elnora Ip, MD       Oral care mouth rinse  15 mL Mouth Rinse PRN Rosan Dayton BROCKS, DO       senna-docusate (Senokot-S) tablet 2 tablet  2 tablet Oral QHS Rihner, Emilie, DO   2 tablet at 12/03/23 2113    Allergies as of 12/02/2023 - Review Complete 12/02/2023  Allergen Reaction Noted   Other Other (See Comments) 06/29/2011   Penicillins Hives 02/24/2012   Sulfa antibiotics Hives 02/24/2012    Family History  Problem Relation Age of Onset   Healthy Mother    Healthy Sister    Stroke Maternal Grandfather     Social History   Socioeconomic History   Marital status: Married    Spouse name: Not on file   Number of children: 3   Years of education: Master's Degree   Highest education level: Not on file  Occupational History   Occupation: Catering manager: [employer]  Tobacco Use   Smoking status: Never   Smokeless tobacco: Never  Vaping Use   Vaping status: Never Used  Substance and Sexual Activity   Alcohol use: No   Drug use: No   Sexual activity: Yes    Partners: Female  Other Topics Concern   Not on file  Social History Narrative   Lives with his wife and 2 children.   Oldest child is adult and lives independently.   Social Drivers of Corporate investment banker Strain: Not on file  Food Insecurity: No Food Insecurity (12/03/2023)   Hunger Vital Sign    Worried About Running Out of Food in the Last Year: Never true    Ran Out of Food in the Last Year: Never true  Transportation Needs: No Transportation Needs (12/03/2023)   PRAPARE - Administrator, Civil Service (Medical): No    Lack of Transportation (Non-Medical): No  Physical Activity: Not on file  Stress: Not on file  Social Connections: Not on file  Intimate Partner Violence: Not At Risk (12/03/2023)   Humiliation, Afraid, Rape, and Kick questionnaire     Fear of Current or Ex-Partner: No    Emotionally Abused: No    Physically Abused: No    Sexually Abused: No    Review of Systems:  All other review of systems negative except as mentioned in the HPI.  Physical Exam: Vital signs BP 118/81 (BP Location: Right Arm)   Pulse 61   Temp 98.1 F (36.7 C) (Oral)   Resp 16   Ht 6' (1.829 m)   Wt 95.3 kg   SpO2 96%   BMI 28.48 kg/m   General:   Alert,  Well-developed, well-nourished, pleasant and cooperative in NAD Lungs:  Clear throughout to auscultation.   Heart:  Regular rate and rhythm; no murmurs, clicks, rubs,  or gallops. Abdomen:  Soft, nontender and nondistended. Normal bowel sounds.   Neuro/Psych:  Normal mood and affect. A and O x  3  Inocente Hausen, MD [facility]

## 2023-12-04 NOTE — Op Note (Signed)
 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 Estimated blood loss:                            Minimal. Estimated Blood Loss:     Estimated blood loss was minimal. Procedure:                Pre-Anesthesia Assessment:                           - Prior to the procedure, a History and Physical                            was performed, and patient medications and                            allergies were reviewed. The patient's tolerance of                            previous anesthesia was also reviewed. The risks                            and benefits of the procedure and the sedation                            options and risks were discussed with the patient.                            All questions were answered, and informed consent                            was obtained. Prior Anticoagulants: The patient has                            taken no anticoagulant or antiplatelet agents. ASA  Grade Assessment: III - A patient with severe                            systemic disease. After reviewing the risks and                            benefits, the patient was deemed in satisfactory                            condition to undergo the procedure.                           After obtaining informed consent, the endoscope was                            passed  under direct vision. Throughout the                            procedure, the patient's blood pressure, pulse, and                            oxygen saturations were monitored continuously. The                            PCF-H190TL ([id number] Olympus slim colonoscope was                            introduced through the mouth and advanced to the                            proximal jejunum. The small bowel enteroscopy was                            accomplished without difficulty. The patient                            tolerated the procedure well. Scope In: Scope Out: Findings:      The examined esophagus was normal.      A single localized 4 mm erosion with no bleeding and no stigmata of       recent bleeding was found in the prepyloric region of the stomach.      The gastric body, gastric antrum, cardia (on retroflexion) and gastric       fundus (on retroflexion) were normal.      There was no evidence of significant pathology in the duodenal bulb.      Six angioectasias with no bleeding were found in the second portion of       the duodenum, in the third portion of the duodenum and in the fourth       portion of the duodenum. Coagulation for tissue destruction using argon       plasma at 0.2 liters/minute and 20 watts was successful. To prevent       bleeding post-maneuver, two hemostatic clips were successfully placed.       There was no bleeding at the end of the procedure.  Six angioectasias with no bleeding were found in the proximal jejunum.       Coagulation for tissue destruction using argon beam at 0.2 liters/minute       and 20 watts was successful. To prevent bleeding post-maneuver, two       hemostatic clips were successfully placed. There was no bleeding at the       end of the procedure. Impression:               - Normal esophagus.                           - Single non-bleeding erosion with no bleeding and                            no stigmata of recent bleeding in  pre-pyloric                            stomach.                           - Normal gastric body, antrum, cardia and gastric                            fundus.                           - Normal duodenal bulb.                           - Six non-bleeding angioectasias in the duodenum.                            Treated with argon plasma coagulation (APC). Clips                            were placed.                           - Six non-bleeding angioectasias in the jejunum.                            Treated with argon beam coagulation. Clips were                            placed.                           - No active bleeding or stigmata of recent bleeding                            seen during today's exam. Recommendation:           - Return patient to hospital ward for ongoing care.                           - In the absence of active bleeding, patient can  resume a regular diet.                           - Check H. pylori stool antigen given isolated                            gastric erosion.                           - Given the chronic and indolent nature of the                            patient's bleeding related to HHT, can consider                            discharge later today if she is overall stable                            postprocedure for outpatient follow-up with his                            hematologist at [facility].                           - Patient can follow-up in the future with Dr.                            San or [facility] if he chooses to consolidate                            all of his care at [facility]                           - Results discussed with patient. Procedure Code(s):        --- Professional ---                           [phone], Small intestinal endoscopy, enteroscopy                            beyond second portion of duodenum, not including                            ileum; with ablation of tumor(s), polyp(s), or                             other lesion(s) not amenable to removal by hot                            biopsy forceps, bipolar cautery or snare technique Diagnosis Code(s):        --- Professional ---                           K31.89, Other diseases of stomach and duodenum  K31.819, Angiodysplasia of stomach and duodenum                            without bleeding                           D64.9, Anemia, unspecified                           K55.21, Angiodysplasia of colon with hemorrhage CPT copyright 2022 American Medical Association. All rights reserved. The codes documented in this report are preliminary and upon coder review may  be revised to meet current compliance requirements. Inocente Hausen, MD 12/04/2023 8:42:23 AM This report has been signed electronically. Number of Addenda: 0

## 2023-12-04 NOTE — Progress Notes (Signed)
 1553 patient discharge home with wife alert x4 able to walk out on room air

## 2023-12-04 NOTE — Plan of Care (Signed)

## 2023-12-04 NOTE — Anesthesia Preprocedure Evaluation (Signed)
 Anesthesia Evaluation  Patient identified by MRN, date of birth, ID band Patient awake    Reviewed: Allergy & Precautions, NPO status , Patient's Chart, lab work & pertinent test results  History of Anesthesia Complications Negative for: history of anesthetic complications  Airway Mallampati: II  TM Distance: >3 FB Neck ROM: Full    Dental  (+) Dental Advisory Given   Pulmonary neg pulmonary ROS   breath sounds clear to auscultation       Cardiovascular negative cardio ROS  Rhythm:Regular     Neuro/Psych negative neurological ROS  negative psych ROS   GI/Hepatic Neg liver ROS,,,?GI bleed   Endo/Other  negative endocrine ROS    Renal/GU negative Renal ROS     Musculoskeletal   Abdominal   Peds  Hematology  (+) Blood dyscrasia, anemia Lab Results      Component                Value               Date                      WBC                      7.0                 12/04/2023                HGB                      8.8 (L)             12/04/2023                HCT                      28.8 (L)            12/04/2023                MCV                      77.2 (L)            12/04/2023                PLT                      244                 12/04/2023              Anesthesia Other Findings   Reproductive/Obstetrics                              Anesthesia Physical Anesthesia Plan  ASA: 3  Anesthesia Plan: MAC   Post-op Pain Management: Minimal or no pain anticipated   Induction: Intravenous  PONV Risk Score and Plan: 1 and Propofol  infusion and Treatment may vary due to age or medical condition  Airway Management Planned: Nasal Cannula, Natural Airway and Simple Face Mask  Additional Equipment: None  Intra-op Plan:   Post-operative Plan:   Informed Consent: I have reviewed the patients History and Physical, chart, labs and discussed the procedure including the risks,  benefits and alternatives for the proposed anesthesia with the patient or authorized representative  who has indicated his/her understanding and acceptance.     Dental advisory given  Plan Discussed with: CRNA  Anesthesia Plan Comments:          Anesthesia Quick Evaluation

## 2023-12-06 LAB — TYPE AND SCREEN
ABO/RH(D): A POS
Antibody Screen: POSITIVE
Donor AG Type: NEGATIVE
Donor AG Type: NEGATIVE
Donor AG Type: NEGATIVE
PT AG Type: NEGATIVE
Unit division: 0
Unit division: 0
Unit division: 0

## 2023-12-06 LAB — BPAM RBC
Blood Product Expiration Date: 202507232359
Blood Product Expiration Date: 202508212359
Blood Product Expiration Date: 202508222359
ISSUE DATE / TIME: 202507171952
ISSUE DATE / TIME: 202507181359
Unit Type and Rh: 600
Unit Type and Rh: 6200
Unit Type and Rh: 6200

## 2023-12-07 NOTE — Anesthesia Postprocedure Evaluation (Signed)
 Anesthesia Post Note  Patient: Marcus Ball  Procedure(s) Performed: ENTEROSCOPY EGD, WITH ARGON PLASMA COAGULATION CONTROL OF HEMORRHAGE, GI TRACT, ENDOSCOPIC, BY CLIPPING OR OVERSEWING     Patient location during evaluation: Endoscopy Anesthesia Type: MAC Level of consciousness: awake and alert Pain management: pain level controlled Vital Signs Assessment: post-procedure vital signs reviewed and stable Respiratory status: spontaneous breathing, nonlabored ventilation, respiratory function stable and patient connected to nasal cannula oxygen Cardiovascular status: stable and blood pressure returned to baseline Postop Assessment: no apparent nausea or vomiting Anesthetic complications: no   No notable events documented.               Jazsmine Macari

## 2023-12-23 NOTE — Therapy (Signed)
 OUTPATIENT PHYSICAL THERAPY THORACOLUMBAR EVALUATION   Patient Name: Marcus Ball MRN: [id number] DOB:1970-07-15, 53 y.o., male Today's Date: 12/24/2023  END OF SESSION:  PT End of Session - 12/24/23 0942     Visit Number 1    Number of Visits 18    Date for PT Re-Evaluation 03/23/24    Authorization Type [employer]    PT Start Time 0935    PT Stop Time 1015    PT Time Calculation (min) 40 min          Past Medical History:  Diagnosis Date   HHT (hereditary hemorrhagic telangiectasia) (HCC)    Past Surgical History:  Procedure Laterality Date   BRAIN SURGERY     COLONOSCOPY WITH PROPOFOL  N/A 11/26/2021   Procedure: COLONOSCOPY WITH PROPOFOL ;  Surgeon: San Sandor GAILS, DO;  Location: [facility] ENDOSCOPY;  Service: Gastroenterology;  Laterality: N/A;   ENTEROSCOPY N/A 12/04/2023   Procedure: ENTEROSCOPY;  Surgeon: Suzann Inocente CHRISTELLA, MD;  Location: [facility] ENDOSCOPY;  Service: Gastroenterology;  Laterality: N/A;   ESOPHAGOGASTRODUODENOSCOPY (EGD) WITH PROPOFOL  N/A 11/26/2021   Procedure: ESOPHAGOGASTRODUODENOSCOPY (EGD) WITH PROPOFOL ;  Surgeon: San Sandor GAILS, DO;  Location: [facility] ENDOSCOPY;  Service: Gastroenterology;  Laterality: N/A;   HEMOSTASIS CLIP PLACEMENT  11/26/2021   Procedure: HEMOSTASIS CLIP PLACEMENT;  Surgeon: San Sandor GAILS, DO;  Location: [facility] ENDOSCOPY;  Service: Gastroenterology;;   HEMOSTASIS CLIP PLACEMENT  12/04/2023   Procedure: CONTROL OF HEMORRHAGE, GI TRACT, ENDOSCOPIC, BY CLIPPING OR OVERSEWING;  Surgeon: Suzann Inocente CHRISTELLA, MD;  Location: [facility] ENDOSCOPY;  Service: Gastroenterology;;   HOT HEMOSTASIS N/A 11/26/2021   Procedure: HOT HEMOSTASIS (ARGON PLASMA COAGULATION/BICAP);  Surgeon: San Sandor GAILS, DO;  Location: [facility] ENDOSCOPY;  Service: Gastroenterology;  Laterality: N/A;   HOT HEMOSTASIS N/A 12/04/2023   Procedure: EGD, WITH ARGON PLASMA COAGULATION;  Surgeon: Suzann Inocente CHRISTELLA, MD;  Location: [facility] ENDOSCOPY;  Service: Gastroenterology;  Laterality: N/A;    LUNG SURGERY     Patient Active Problem List   Diagnosis Date Noted   Acute blood loss anemia 12/02/2023   Diverticulosis of colon without hemorrhage    Grade II internal hemorrhoids    Angiectasia of gastrointestinal tract    Symptomatic anemia 11/25/2021   Iron  deficiency anemia 11/25/2021   Heme positive stool 11/25/2021   Iron  deficiency 06/27/2015   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 Nerve pain down the leg and foot without traumatic MOI. Pt has had CIS and oral steriod that has greatly reduced the pain. It is still present but better. Denies NT in both LE, no sudden onset leg weakness or LOB.  Forward bending hurts more, ext is also painful after a certain point. No pain with stairs. No noticeable leg strength difference. Pt feels is 20% limited.  PERTINENT HISTORY:  15 years ago- microdiscectomy on L   PAIN:  Are you having pain? Yes: NPRS scale: 1-2/10 Pain location: R side lower back, into the foot  Pain description: sharp, shooting, dull Aggravating factors: standing, sitting, lifting, dishes, slightly bent over positions Relieving factors: heating pad, meds, stretching, standing   PRECAUTIONS: None  RED FLAGS: None   WEIGHT BEARING RESTRICTIONS:  No  FALLS:  Has patient fallen in last 6 months? No  LIVING ENVIRONMENT: Lives with: lives with their family Lives in: House/apartment Stairs: Yes Has following equipment at home: None  OCCUPATION: principal at a school;   PLOF: Independent  PATIENT GOALS: be able to able golf again, run for exercise, establish normal activity without   OBJECTIVE:  Note: Objective measures were completed at Evaluation unless otherwise noted.  DIAGNOSTIC FINDINGS:  Disc levels:   T12-L1: No significant disc bulge. No significant spinal canal stenosis or foraminal stenosis.   L1-2: Disc desiccation. Diffuse disc bulge eccentric to the left which indents the ventral thecal sac. Additional left foraminal disc protrusion. No significant spinal canal stenosis. Disc bulge results in narrowing of the left lateral recess without significant impingement upon the traversing nerve roots. Mild facet arthrosis. No significant foraminal stenosis.   L2-3: Minimal disc bulge. Mild facet arthrosis. No significant spinal canal stenosis. No significant foraminal stenosis.   L3-4: Disc desiccation and mild disc height loss. Diffuse disc bulge. Mild-to-moderate facet arthrosis. No significant spinal canal stenosis. Mild foraminal stenosis on the left.   L4-5: Disc desiccation and moderate disc height loss. Diffuse disc bulge. Moderate facet arthrosis which indents the dorsal lateral thecal sac. There is narrowing of the lateral recesses more pronounced on the right with impingement upon the traversing right L5 nerve roots. Moderate foraminal stenosis.   L5-S1: Disc desiccation and mild disc height loss. Diffuse disc bulge. Moderate facet arthrosis. No significant spinal canal stenosis. No significant foraminal stenosis.   IMPRESSION: Degenerative changes of the lumbar spine as above. No high-grade spinal canal stenosis.   Disc bulge and moderate facet arthrosis at L4-5 resulting in lateral recess  narrowing with impingement upon the traversing right L5 nerve roots.   No high-grade foraminal stenosis.     Electronically Signed   By: Donnice Mania M.D.   On: 09/08/2023 17:51  PATIENT SURVEYS:  Oswestry Score: 10 / 50 or 20 %  COGNITION: Overall cognitive status: Within functional limits for tasks assessed     SENSATION: WFL  MUSCLE LENGTH: Limited HS 90/90 on R without radiating pain  POSTURE: rounded shoulders, forward head, and weight shift left  PALPATION:   TTP noted in lumbar paraspinals and QL with expected paraspinal hypertonicity; Joint extension stiffness with UPA and CPA L1-5     LUMBAR ROM:    AROM eval  Flexion WFL  Extension WFL P!   Right lateral flexion WFL   Left lateral flexion WFL  Right rotation WFL  Left rotation WFL   (Blank rows = not tested)   LOWER EXTREMITY ROM:  PROM Right eval Left eval  Hip flexion 120 120  Hip extension 5 5  Hip abduction    Hip adduction    Hip internal rotation 40 40  Hip external rotation 45 45   (Blank rows = not tested)   LOWER EXTREMITY MMT:     MMT Right eval Left eval  Hip flexion 83.4 p! 86.4  Hip extension 65.4 p! 57.3  Hip abduction 74.4 65.7  Hip adduction    Hip internal rotation      Hip external rotation      Knee flexion      Knee extension      Ankle dorsiflexion      Ankle plantarflexion      Ankle inversion      Ankle eversion       (Blank rows = not tested)     LUMBAR SPECIAL TESTS:  Straight leg raise test: positive Single leg stance test: negative, FABER test: negative, and Trendelenburg sign: negative    GAIT: Distance walked: 32ft Assistive device utilized: None Level of assistance: Complete Independence Comments: toe out,WFL otherwise  Stairs: WFL  BW squats: decreased depth, quad dominant motion, lack of hip hinge, pain at bottom position   TODAY'S TREATMENT:                                                                                                                               DATE:    Eval:    Exercises - Seated Quadratus Lumborum Stretch with Arm Overhead  - 2 x daily - 7 x weekly - 1 sets - 3 reps - 30 hold - Standing Sidebends  - 2 x daily - 7 x weekly - 2 sets - 10 reps - Jefferson Curl  - 2 x daily - 7 x weekly - 2 sets - 5 reps      PATIENT EDUCATION:  Education details: MOI, diagnosis, prognosis, anatomy, exercise progression, DOMS expectations, muscle firing,  envelope of function, HEP, POC   Person educated: Patient Education method: Explanation, Demonstration, Tactile cues, Verbal cues, and Handouts Education comprehension: verbalized understanding, returned demonstration, verbal cues required, tactile cues required, and needs further education   HOME EXERCISE PROGRAM:   Access Code: [email or web address] URL: [email or web address] Date: 12/24/2023 Prepared by: Dale Call     CLINICAL IMPRESSION: Patient is a 53 y.o. male who was seen today for physical therapy evaluation and treatment for c/c of LBP. Pt's s/s appear consistent with lumbar radicular pain. Pt's pain is minimally sensitive and irritable with movement. Pt's is more stiffness dominant than pain limited at this time. Plan to continue with lumbar/hip mobility, lumbopelvic strength at future sessions. Plan for lifting mechanics edu, RDL/DL, squats, and progressive lumbar mobility at future sessions. Pt would benefit from continued skilled therapy in order to reach goals and maximize functional lumbopelvic strength and ROM for return to PLOF, normalized ADL, occupation, and childcare duties.    OBJECTIVE IMPAIRMENTS: Abnormal gait,  decreased activity tolerance, decreased endurance, decreased mobility, difficulty walking, decreased ROM, decreased strength, hypomobility, increased edema, impaired flexibility, and pain.    ACTIVITY LIMITATIONS: bending, squatting, lifting, carrying and locomotion level   PARTICIPATION LIMITATIONS: meal prep, cleaning, laundry,  driving, shopping, community activity, yard work, and hobbies   PERSONAL FACTORS: fitness, time since onset of injury are also affecting patient's functional outcome.    REHAB POTENTIAL: Good   CLINICAL DECISION MAKING: stable/uncomplicated   EVALUATION COMPLEXITY: good     GOALS:     SHORT TERM GOALS:  02/03/2024       Pt will be independent and compliant with HEP for improved pain, ROM, strength, and function.    Goal status: INITIAL     2. Pt will demonstrate at least a 12.8% improvement in Oswestry Index in order to demonstrate a clinically significant change in LBP and function.    Goal status: INITIAL       LONG TERM GOALS: 03/22/2024        Pt  will become independent with final HEP in order to demonstrate synthesis of PT education.   Goal status: INITIAL   2.  Pt will demonstrate at least a 25% improvement in Oswestry Index in order to demonstrate a clinically significant change in LBP and function.  Goal status: INITIAL     3.  Pt will be able to demonstrate/report ability to walk >30 mins on varying terrain without pain in order to demonstrate functional improvement and tolerance to exercise and community mobility.   Baseline:  Goal status: INITIAL   4.  Pt will be able to lift/squat/hold >25 lbs in order to demonstrate functional improvement in lumbopelvic strength for return to PLOF and caregiver duties.     Baseline:  Goal status: INITIAL   5. Pt will be able to demonstrate/report ability to sit/stand/sleep for extended periods of time without pain in order to demonstrate functional improvement and tolerance to static positioning.   Goal status: INITIAL   PLAN:   PT FREQUENCY:  1-2x/week    PT DURATION: 12 weeks    PLANNED INTERVENTIONS: Therapeutic exercises, Therapeutic activity, Neuromuscular re-education, Balance training, Biofeedback, Gait training, Patient/Family education, Self Care, Joint mobilization, Stair training, DME instructions,  Aquatic Therapy, Dry Needling, Electrical stimulation, Cryotherapy, Moist heat, scar mobilization, Taping, Ultrasound, Manual therapy, and Re-evaluation   PLAN FOR NEXT SESSION: lumbopelvic strength, lifting technique, lumbopelvic mobility, rotation/anti rotation for golf    Dale Call, PT 12/24/2023, 11:16 AM

## 2023-12-24 ENCOUNTER — Other Ambulatory Visit: Payer: Self-pay

## 2023-12-24 ENCOUNTER — Ambulatory Visit (HOSPITAL_BASED_OUTPATIENT_CLINIC_OR_DEPARTMENT_OTHER): Attending: Orthopedic Surgery | Admitting: Physical Therapy

## 2023-12-24 ENCOUNTER — Encounter (HOSPITAL_BASED_OUTPATIENT_CLINIC_OR_DEPARTMENT_OTHER): Payer: Self-pay | Admitting: Physical Therapy

## 2023-12-24 DIAGNOSIS — M545 Low back pain, unspecified: Secondary | ICD-10-CM | POA: Insufficient documentation

## 2023-12-24 DIAGNOSIS — M5416 Radiculopathy, lumbar region: Secondary | ICD-10-CM | POA: Diagnosis not present

## 2023-12-24 DIAGNOSIS — M6281 Muscle weakness (generalized): Secondary | ICD-10-CM | POA: Diagnosis present

## 2023-12-31 ENCOUNTER — Encounter (HOSPITAL_BASED_OUTPATIENT_CLINIC_OR_DEPARTMENT_OTHER): Payer: Self-pay | Admitting: Physical Therapy

## 2023-12-31 ENCOUNTER — Ambulatory Visit (HOSPITAL_BASED_OUTPATIENT_CLINIC_OR_DEPARTMENT_OTHER): Admitting: Physical Therapy

## 2023-12-31 DIAGNOSIS — M6281 Muscle weakness (generalized): Secondary | ICD-10-CM

## 2023-12-31 DIAGNOSIS — M545 Low back pain, unspecified: Secondary | ICD-10-CM

## 2023-12-31 NOTE — Therapy (Signed)
 OUTPATIENT PHYSICAL THERAPY THORACOLUMBAR TREATMENT  Patient Name: Marcus Ball MRN: [id number] DOB:1970-06-15, 53 y.o., male Today's Date: 12/31/2023  END OF SESSION:  PT End of Session - 12/31/23 0924     Visit Number 2    Number of Visits 18    Date for PT Re-Evaluation 03/23/24    Authorization Type [employer]et[employer]    PT Start Time 0930    PT Stop Time 1010    PT Time Calculation (min) 40 min    Activity Tolerance Patient tolerated treatment well    Behavior During Therapy WFL for tasks assessed/performed          Past Medical History:  Diagnosis Date   HHT (hereditary hemorrhagic telangiectasia) (HCC)    Past Surgical History:  Procedure Laterality Date   BRAIN SURGERY     COLONOSCOPY WITH PROPOFOL  N/A 11/26/2021   Procedure: COLONOSCOPY WITH PROPOFOL ;  Surgeon: San Sandor GAILS, DO;  Location: [facility] ENDOSCOPY;  Service: Gastroenterology;  Laterality: N/A;   ENTEROSCOPY N/A 12/04/2023   Procedure: ENTEROSCOPY;  Surgeon: Suzann Inocente CHRISTELLA, MD;  Location: [facility] ENDOSCOPY;  Service: Gastroenterology;  Laterality: N/A;   ESOPHAGOGASTRODUODENOSCOPY (EGD) WITH PROPOFOL  N/A 11/26/2021   Procedure: ESOPHAGOGASTRODUODENOSCOPY (EGD) WITH PROPOFOL ;  Surgeon: San Sandor GAILS, DO;  Location: [facility] ENDOSCOPY;  Service: Gastroenterology;  Laterality: N/A;   HEMOSTASIS CLIP PLACEMENT  11/26/2021   Procedure: HEMOSTASIS CLIP PLACEMENT;  Surgeon: San Sandor GAILS, DO;  Location: [facility] ENDOSCOPY;  Service: Gastroenterology;;   HEMOSTASIS CLIP PLACEMENT  12/04/2023   Procedure: CONTROL OF HEMORRHAGE, GI TRACT, ENDOSCOPIC, BY CLIPPING OR OVERSEWING;  Surgeon: Suzann Inocente CHRISTELLA, MD;  Location: [facility] ENDOSCOPY;  Service: Gastroenterology;;   HOT HEMOSTASIS N/A 11/26/2021   Procedure: HOT HEMOSTASIS (ARGON PLASMA COAGULATION/BICAP);  Surgeon: San Sandor GAILS, DO;  Location: [facility] ENDOSCOPY;  Service: Gastroenterology;  Laterality: N/A;   HOT HEMOSTASIS N/A 12/04/2023   Procedure: EGD, WITH ARGON PLASMA  COAGULATION;  Surgeon: Suzann Inocente CHRISTELLA, MD;  Location: [facility] ENDOSCOPY;  Service: Gastroenterology;  Laterality: N/A;   LUNG SURGERY     Patient Active Problem List   Diagnosis Date Noted   Acute blood loss anemia 12/02/2023   Diverticulosis of colon without hemorrhage    Grade II internal hemorrhoids    Angiectasia of gastrointestinal tract    Symptomatic anemia 11/25/2021   Iron  deficiency anemia 11/25/2021   Heme positive stool 11/25/2021   Iron  deficiency 06/27/2015   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 Reports no pain with home exercise. Pt had pain with moving boxes.    Eval:  Pain on R side of low back. Nerve pain down the leg and foot without traumatic MOI. Pt has had CIS and oral steriod that has greatly reduced the pain. It is still present but better. Denies NT in both LE, no sudden onset leg weakness or LOB.  Forward bending hurts more, ext is also painful after a certain point. No pain with stairs. No noticeable leg strength difference. Pt feels is 20% limited.  PERTINENT HISTORY:  15 years ago- microdiscectomy on L   PAIN:  Are you having pain? Yes: NPRS scale: 1-2/10 Pain location: R side lower back, into the foot  Pain description: sharp, shooting,  dull Aggravating factors: standing, sitting, lifting, dishes, slightly bent over positions Relieving factors: heating pad, meds, stretching, standing   PRECAUTIONS: None  RED FLAGS: None   WEIGHT BEARING RESTRICTIONS: No  FALLS:  Has patient fallen in last 6 months? No  LIVING ENVIRONMENT: Lives with: lives with their family Lives in: House/apartment Stairs: Yes Has following equipment at home: None  OCCUPATION: principal at a school;   PLOF: Independent  PATIENT GOALS: be able to able golf again, run for exercise, establish normal activity without   OBJECTIVE:  Note: Objective measures were completed at Evaluation unless otherwise noted.  DIAGNOSTIC FINDINGS:  Disc levels:   T12-L1: No significant disc bulge. No significant spinal canal stenosis or foraminal stenosis.   L1-2: Disc desiccation. Diffuse disc bulge eccentric to the left which indents the ventral thecal sac. Additional left foraminal disc protrusion. No significant spinal canal stenosis. Disc bulge results in narrowing of the left lateral recess without significant impingement upon the traversing nerve roots. Mild facet arthrosis. No significant foraminal stenosis.   L2-3: Minimal disc bulge. Mild facet arthrosis. No significant spinal canal stenosis. No significant foraminal stenosis.   L3-4: Disc desiccation and mild disc height loss. Diffuse disc bulge. Mild-to-moderate facet arthrosis. No significant spinal canal stenosis. Mild foraminal stenosis on the left.   L4-5: Disc desiccation and moderate disc height loss. Diffuse disc bulge. Moderate facet arthrosis which indents the dorsal lateral thecal sac. There is narrowing of the lateral recesses more pronounced on the right with impingement upon the traversing right L5 nerve roots. Moderate foraminal stenosis.   L5-S1: Disc desiccation and mild disc height loss. Diffuse disc bulge. Moderate facet arthrosis. No significant spinal  canal stenosis. No significant foraminal stenosis.   IMPRESSION: Degenerative changes of the lumbar spine as above. No high-grade spinal canal stenosis.   Disc bulge and moderate facet arthrosis at L4-5 resulting in lateral recess narrowing with impingement upon the traversing right L5 nerve roots.   No high-grade foraminal stenosis.     Electronically Signed   By: Donnice Mania M.D.   On: 09/08/2023 17:51  PATIENT SURVEYS:  Oswestry Score: 10 / 50 or 20 %  COGNITION: Overall cognitive status: Within functional limits for tasks assessed     SENSATION: WFL  MUSCLE LENGTH: Limited HS 90/90 on R without radiating pain  POSTURE: rounded shoulders, forward head, and weight shift left  PALPATION:   TTP noted in lumbar paraspinals and QL with expected paraspinal hypertonicity; Joint extension stiffness with UPA and CPA L1-5     LUMBAR ROM:    AROM eval  Flexion WFL  Extension WFL P!   Right lateral flexion WFL   Left lateral flexion WFL  Right  rotation WFL  Left rotation WFL   (Blank rows = not tested)   LOWER EXTREMITY ROM:     PROM Right eval Left eval  Hip flexion 120 120  Hip extension 5 5  Hip abduction    Hip adduction    Hip internal rotation 40 40  Hip external rotation 45 45   (Blank rows = not tested)   LOWER EXTREMITY MMT:     MMT Right eval Left eval  Hip flexion 83.4 p! 86.4  Hip extension 65.4 p! 57.3  Hip abduction 74.4 65.7  Hip adduction    Hip internal rotation      Hip external rotation      Knee flexion      Knee extension      Ankle dorsiflexion      Ankle plantarflexion      Ankle inversion      Ankle eversion       (Blank rows = not tested)     LUMBAR SPECIAL TESTS:  Straight leg raise test: positive Single leg stance test: negative, FABER test: negative, and Trendelenburg sign: negative    GAIT: Distance walked: 69ft Assistive device utilized: None Level of assistance: Complete Independence Comments: toe out,WFL  otherwise  Stairs: WFL  BW squats: decreased depth, quad dominant motion, lack of hip hinge, pain at bottom position   TODAY'S TREATMENT:                                                                                                                              DATE:    8/15  STM R QL, R lumbar paraspinals R UPA L3-5  Child's pose with SB 10s 10x Sideplank with RTB at knees 2x10  SL RDL 2x10 ea Blue TB paloff 2x10 Black TB sidebend 15x   15lb KB hip hinge for technique  Eval:    Exercises - Seated Quadratus Lumborum Stretch with Arm Overhead  - 2 x daily - 7 x weekly - 1 sets - 3 reps - 30 hold - Standing Sidebends  - 2 x daily - 7 x weekly - 2 sets - 10 reps - Jefferson Curl  - 2 x daily - 7 x weekly - 2 sets - 5 reps      PATIENT EDUCATION:  Education details: anatomy, exercise progression, DOMS expectations, muscle firing,  envelope of function, HEP, POC   Person educated: Patient Education method: Explanation, Demonstration, Tactile cues, Verbal cues, and Handouts Education comprehension: verbalized understanding, returned demonstration, verbal cues required, tactile cues required, and needs further education   HOME EXERCISE PROGRAM:   Access Code: [email or web address] URL: [email or web address] Date: 12/24/2023 Prepared by: Dale Call     CLINICAL IMPRESSION: Pt with good tolerance to progressive lumbopelvic loading and stability exercise today without pain. Pt presents with R lumbar soft tissue tightness but improves with manual with palpable increase in soft tissue extensibility. Pt able to tolerate loading in all planes without R QL  irritation or radiating pain. Able to return demo lifting technique and use of hip hinging. Plan to continue with progressive overload and increasing intensity of strength/resistance. Pt able to start machine based training in the gym. Prioritize BW or banded exercise at home as pt does not have access to equipment. Pt would  benefit from continued skilled therapy in order to reach goals and maximize functional lumbopelvic strength and ROM for return to PLOF, normalized ADL, occupation, and childcare duties.    OBJECTIVE IMPAIRMENTS: Abnormal gait, decreased activity tolerance, decreased endurance, decreased mobility, difficulty walking, decreased ROM, decreased strength, hypomobility, increased edema, impaired flexibility, and pain.    ACTIVITY LIMITATIONS: bending, squatting, lifting, carrying and locomotion level   PARTICIPATION LIMITATIONS: meal prep, cleaning, laundry, driving, shopping, community activity, yard work, and hobbies   PERSONAL FACTORS: fitness, time since onset of injury are also affecting patient's functional outcome.    REHAB POTENTIAL: Good   CLINICAL DECISION MAKING: stable/uncomplicated   EVALUATION COMPLEXITY: good     GOALS:     SHORT TERM GOALS:  02/03/2024       Pt will be independent and compliant with HEP for improved pain, ROM, strength, and function.    Goal status: INITIAL     2. Pt will demonstrate at least a 12.8% improvement in Oswestry Index in order to demonstrate a clinically significant change in LBP and function.    Goal status: INITIAL       LONG TERM GOALS: 03/22/2024        Pt  will become independent with final HEP in order to demonstrate synthesis of PT education.   Goal status: INITIAL   2.  Pt will demonstrate at least a 25% improvement in Oswestry Index in order to demonstrate a clinically significant change in LBP and function.  Goal status: INITIAL     3.  Pt will be able to demonstrate/report ability to walk >30 mins on varying terrain without pain in order to demonstrate functional improvement and tolerance to exercise and community mobility.   Baseline:  Goal status: INITIAL   4.  Pt will be able to lift/squat/hold >25 lbs in order to demonstrate functional improvement in lumbopelvic strength for return to PLOF and caregiver duties.      Baseline:  Goal status: INITIAL   5. Pt will be able to demonstrate/report ability to sit/stand/sleep for extended periods of time without pain in order to demonstrate functional improvement and tolerance to static positioning.   Goal status: INITIAL   PLAN:   PT FREQUENCY:  1-2x/week    PT DURATION: 12 weeks    PLANNED INTERVENTIONS: Therapeutic exercises, Therapeutic activity, Neuromuscular re-education, Balance training, Biofeedback, Gait training, Patient/Family education, Self Care, Joint mobilization, Stair training, DME instructions, Aquatic Therapy, Dry Needling, Electrical stimulation, Cryotherapy, Moist heat, scar mobilization, Taping, Ultrasound, Manual therapy, and Re-evaluation   PLAN FOR NEXT SESSION: lumbopelvic strength, barbell strength, lumbopelvic mobility, rotation/anti rotation for golf    Dale Call, PT 12/31/2023, 10:16 AM

## 2024-01-05 ENCOUNTER — Ambulatory Visit (HOSPITAL_BASED_OUTPATIENT_CLINIC_OR_DEPARTMENT_OTHER): Admitting: Physical Therapy

## 2024-01-05 ENCOUNTER — Encounter (HOSPITAL_BASED_OUTPATIENT_CLINIC_OR_DEPARTMENT_OTHER): Payer: Self-pay | Admitting: Physical Therapy

## 2024-01-05 DIAGNOSIS — M545 Low back pain, unspecified: Secondary | ICD-10-CM | POA: Diagnosis not present

## 2024-01-05 DIAGNOSIS — M6281 Muscle weakness (generalized): Secondary | ICD-10-CM

## 2024-01-05 NOTE — Therapy (Signed)
 OUTPATIENT PHYSICAL THERAPY THORACOLUMBAR TREATMENT  Patient Name: Marcus Ball MRN: [id number] DOB:11/19/1970, 53 y.o., male Today's Date: 01/06/2024  END OF SESSION:  PT End of Session - 01/06/24 0814     Visit Number 3    Number of Visits 18    Date for PT Re-Evaluation 03/23/24    Authorization Type [employer]    PT Start Time 1600    PT Stop Time 1633   Patient requested to leave early   PT Time Calculation (min) 33 min    Activity Tolerance Patient tolerated treatment well    Behavior During Therapy [facility] for tasks assessed/performed           Past Medical History:  Diagnosis Date   HHT (hereditary hemorrhagic telangiectasia) (HCC)    Past Surgical History:  Procedure Laterality Date   BRAIN SURGERY     COLONOSCOPY WITH PROPOFOL  N/A 11/26/2021   Procedure: COLONOSCOPY WITH PROPOFOL ;  Surgeon: San Sandor GAILS, DO;  Location: [facility] ENDOSCOPY;  Service: Gastroenterology;  Laterality: N/A;   ENTEROSCOPY N/A 12/04/2023   Procedure: ENTEROSCOPY;  Surgeon: Suzann Inocente CHRISTELLA, MD;  Location: [facility] ENDOSCOPY;  Service: Gastroenterology;  Laterality: N/A;   ESOPHAGOGASTRODUODENOSCOPY (EGD) WITH PROPOFOL  N/A 11/26/2021   Procedure: ESOPHAGOGASTRODUODENOSCOPY (EGD) WITH PROPOFOL ;  Surgeon: San Sandor GAILS, DO;  Location: [facility] ENDOSCOPY;  Service: Gastroenterology;  Laterality: N/A;   HEMOSTASIS CLIP PLACEMENT  11/26/2021   Procedure: HEMOSTASIS CLIP PLACEMENT;  Surgeon: San Sandor GAILS, DO;  Location: [facility] ENDOSCOPY;  Service: Gastroenterology;;   HEMOSTASIS CLIP PLACEMENT  12/04/2023   Procedure: CONTROL OF HEMORRHAGE, GI TRACT, ENDOSCOPIC, BY CLIPPING OR OVERSEWING;  Surgeon: Suzann Inocente CHRISTELLA, MD;  Location: [facility] ENDOSCOPY;  Service: Gastroenterology;;   HOT HEMOSTASIS N/A 11/26/2021   Procedure: HOT HEMOSTASIS (ARGON PLASMA COAGULATION/BICAP);  Surgeon: San Sandor GAILS, DO;  Location: [facility] ENDOSCOPY;  Service: Gastroenterology;  Laterality: N/A;   HOT HEMOSTASIS N/A 12/04/2023    Procedure: EGD, WITH ARGON PLASMA COAGULATION;  Surgeon: Suzann Inocente CHRISTELLA, MD;  Location: [facility] ENDOSCOPY;  Service: Gastroenterology;  Laterality: N/A;   LUNG SURGERY     Patient Active Problem List   Diagnosis Date Noted   Acute blood loss anemia 12/02/2023   Diverticulosis of colon without hemorrhage    Grade II internal hemorrhoids    Angiectasia of gastrointestinal tract    Symptomatic anemia 11/25/2021   Iron  deficiency anemia 11/25/2021   Heme positive stool 11/25/2021   Iron  deficiency 06/27/2015   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 Reports no pain with home exercise. Pt had pain with moving boxes.    Eval:  Pain on R side of low back. Nerve pain down the leg and foot without traumatic MOI. Pt has had CIS and oral steriod that has greatly reduced the pain. It is still present but better. Denies NT in both LE, no sudden onset leg weakness or LOB.  Forward bending hurts more, ext is also painful after a certain point. No pain with stairs. No noticeable leg strength difference. Pt feels is 20% limited.  PERTINENT HISTORY:  15 years ago- microdiscectomy on L   PAIN:  Are you having pain? Yes: NPRS scale: 1-2/10 Pain location: R side lower back, into the foot  Pain  description: sharp, shooting, dull Aggravating factors: standing, sitting, lifting, dishes, slightly bent over positions Relieving factors: heating pad, meds, stretching, standing   PRECAUTIONS: None  RED FLAGS: None   WEIGHT BEARING RESTRICTIONS: No  FALLS:  Has patient fallen in last 6 months? No  LIVING ENVIRONMENT: Lives with: lives with their family Lives in: House/apartment Stairs: Yes Has following equipment at home: None  OCCUPATION: principal at a school;   PLOF: Independent  PATIENT GOALS: be able to able golf again, run for exercise, establish normal activity without   OBJECTIVE:  Note: Objective measures were completed at Evaluation unless otherwise noted.  DIAGNOSTIC FINDINGS:  Disc levels:   T12-L1: No significant disc bulge. No significant spinal canal stenosis or foraminal stenosis.   L1-2: Disc desiccation. Diffuse disc bulge eccentric to the left which indents the ventral thecal sac. Additional left foraminal disc protrusion. No significant spinal canal stenosis. Disc bulge results in narrowing of the left lateral recess without significant impingement upon the traversing nerve roots. Mild facet arthrosis. No significant foraminal stenosis.   L2-3: Minimal disc bulge. Mild facet arthrosis. No significant spinal canal stenosis. No significant foraminal stenosis.   L3-4: Disc desiccation and mild disc height loss. Diffuse disc bulge. Mild-to-moderate facet arthrosis. No significant spinal canal stenosis. Mild foraminal stenosis on the left.   L4-5: Disc desiccation and moderate disc height loss. Diffuse disc bulge. Moderate facet arthrosis which indents the dorsal lateral thecal sac. There is narrowing of the lateral recesses more pronounced on the right with impingement upon the traversing right L5 nerve roots. Moderate foraminal stenosis.   L5-S1: Disc desiccation and mild disc height loss. Diffuse disc bulge. Moderate facet arthrosis. No  significant spinal canal stenosis. No significant foraminal stenosis.   IMPRESSION: Degenerative changes of the lumbar spine as above. No high-grade spinal canal stenosis.   Disc bulge and moderate facet arthrosis at L4-5 resulting in lateral recess narrowing with impingement upon the traversing right L5 nerve roots.   No high-grade foraminal stenosis.     Electronically Signed   By: Donnice Mania M.D.   On: 09/08/2023 17:51  PATIENT SURVEYS:  Oswestry Score: 10 / 50 or 20 %  COGNITION: Overall cognitive status: Within functional limits for tasks assessed     SENSATION: WFL  MUSCLE LENGTH: Limited HS 90/90 on R without radiating pain  POSTURE: rounded shoulders, forward head, and weight shift left  PALPATION:   TTP noted in lumbar paraspinals and QL with expected paraspinal hypertonicity; Joint extension stiffness with UPA and CPA L1-5     LUMBAR ROM:    AROM eval  Flexion WFL  Extension WFL P!   Right lateral flexion WFL   Left lateral flexion WFL  Right  rotation WFL  Left rotation WFL   (Blank rows = not tested)   LOWER EXTREMITY ROM:     PROM Right eval Left eval  Hip flexion 120 120  Hip extension 5 5  Hip abduction    Hip adduction    Hip internal rotation 40 40  Hip external rotation 45 45   (Blank rows = not tested)   LOWER EXTREMITY MMT:     MMT Right eval Left eval  Hip flexion 83.4 p! 86.4  Hip extension 65.4 p! 57.3  Hip abduction 74.4 65.7  Hip adduction    Hip internal rotation      Hip external rotation      Knee flexion      Knee extension      Ankle dorsiflexion      Ankle plantarflexion      Ankle inversion      Ankle eversion       (Blank rows = not tested)     LUMBAR SPECIAL TESTS:  Straight leg raise test: positive Single leg stance test: negative, FABER test: negative, and Trendelenburg sign: negative    GAIT: Distance walked: 31ft Assistive device utilized: None Level of assistance: Complete  Independence Comments: toe out,WFL otherwise  Stairs: WFL  BW squats: decreased depth, quad dominant motion, lack of hip hinge, pain at bottom position   TODAY'S TREATMENT:                                                                                                                              DATE:   8/20 Manual: assessed patients trigger points  Shown how to work trigger points with tennis ball   There-ex: gluteal stretch 3x20 sec hold   Neuro re-ed:  Shoulder extension green low RPE so moved to blue 3x12  Row blue 3x12 RPE of 5  Chop 2x10 green  Pallof press 2x10 green   Discussed abdominal breathing and TA activation for all exercises.   8/15  STM R QL, R lumbar paraspinals R UPA L3-5  Child's pose with SB 10s 10x Sideplank with RTB at knees 2x10  SL RDL 2x10 ea Blue TB paloff 2x10 Black TB sidebend 15x   15lb KB hip hinge for technique  Eval:    Exercises - Seated Quadratus Lumborum Stretch with Arm Overhead  - 2 x daily - 7 x weekly - 1 sets - 3 reps - 30 hold - Standing Sidebends  - 2 x daily - 7 x weekly - 2 sets - 10 reps - Jefferson Curl  - 2 x daily - 7 x weekly - 2 sets - 5 reps      PATIENT EDUCATION:  Education details: anatomy, exercise progression, DOMS expectations, muscle firing,  envelope of function, HEP, POC   Person educated: Patient Education method: Explanation, Demonstration, Tactile cues, Verbal cues, and Handouts Education comprehension: verbalized understanding, returned demonstration, verbal cues required, tactile cues required, and needs further education  HOME EXERCISE PROGRAM:   Access Code: [email or web address] URL: [email or web address] Date: 12/24/2023 Prepared by: Dale Call     CLINICAL IMPRESSION: The patient tolerated treatment well. He had mild spasming in his lower paraspinals and gluteal. He was shown self soft tissue mobilization and stretching. We expanded his standing band exercises. He was advised to try  to do 2-3x a week if he can. We reviewed how to use RPE for loading. We also expanded his golf exercises. He has one more visit scheduled. We will continue to expand his HEP then likely D/C  OBJECTIVE IMPAIRMENTS: Abnormal gait, decreased activity tolerance, decreased endurance, decreased mobility, difficulty walking, decreased ROM, decreased strength, hypomobility, increased edema, impaired flexibility, and pain.    ACTIVITY LIMITATIONS: bending, squatting, lifting, carrying and locomotion level   PARTICIPATION LIMITATIONS: meal prep, cleaning, laundry, driving, shopping, community activity, yard work, and hobbies   PERSONAL FACTORS: fitness, time since onset of injury are also affecting patient's functional outcome.    REHAB POTENTIAL: Good   CLINICAL DECISION MAKING: stable/uncomplicated   EVALUATION COMPLEXITY: good     GOALS:     SHORT TERM GOALS:  02/03/2024       Pt will be independent and compliant with HEP for improved pain, ROM, strength, and function.    Goal status: INITIAL     2. Pt will demonstrate at least a 12.8% improvement in Oswestry Index in order to demonstrate a clinically significant change in LBP and function.    Goal status: INITIAL       LONG TERM GOALS: 03/22/2024        Pt  will become independent with final HEP in order to demonstrate synthesis of PT education.   Goal status: INITIAL   2.  Pt will demonstrate at least a 25% improvement in Oswestry Index in order to demonstrate a clinically significant change in LBP and function.  Goal status: INITIAL     3.  Pt will be able to demonstrate/report ability to walk >30 mins on varying terrain without pain in order to demonstrate functional improvement and tolerance to exercise and community mobility.   Baseline:  Goal status: INITIAL   4.  Pt will be able to lift/squat/hold >25 lbs in order to demonstrate functional improvement in lumbopelvic strength for return to PLOF and caregiver duties.      Baseline:  Goal status: INITIAL   5. Pt will be able to demonstrate/report ability to sit/stand/sleep for extended periods of time without pain in order to demonstrate functional improvement and tolerance to static positioning.   Goal status: INITIAL   PLAN:   PT FREQUENCY:  1-2x/week    PT DURATION: 12 weeks    PLANNED INTERVENTIONS: Therapeutic exercises, Therapeutic activity, Neuromuscular re-education, Balance training, Biofeedback, Gait training, Patient/Family education, Self Care, Joint mobilization, Stair training, DME instructions, Aquatic Therapy, Dry Needling, Electrical stimulation, Cryotherapy, Moist heat, scar mobilization, Taping, Ultrasound, Manual therapy, and Re-evaluation   PLAN FOR NEXT SESSION: lumbopelvic strength, barbell strength, lumbopelvic mobility, rotation/anti rotation for golf    Alm JINNY Don, PT 01/06/2024, 8:16 AM

## 2024-01-06 ENCOUNTER — Encounter (HOSPITAL_BASED_OUTPATIENT_CLINIC_OR_DEPARTMENT_OTHER): Payer: Self-pay | Admitting: Physical Therapy

## 2024-01-20 ENCOUNTER — Encounter (HOSPITAL_BASED_OUTPATIENT_CLINIC_OR_DEPARTMENT_OTHER)

## 2024-02-07 ENCOUNTER — Ambulatory Visit: Admitting: Orthopedic Surgery

## 2024-03-20 ENCOUNTER — Encounter: Payer: Self-pay | Admitting: Radiology

## 2024-04-28 ENCOUNTER — Emergency Department (HOSPITAL_BASED_OUTPATIENT_CLINIC_OR_DEPARTMENT_OTHER)
Admission: EM | Admit: 2024-04-28 | Discharge: 2024-04-28 | Disposition: A | Discharge status: Home / Self Care | Source: Ambulatory Visit | Attending: Emergency Medicine | Admitting: Emergency Medicine

## 2024-04-28 ENCOUNTER — Other Ambulatory Visit: Payer: Self-pay

## 2024-04-28 ENCOUNTER — Emergency Department (HOSPITAL_BASED_OUTPATIENT_CLINIC_OR_DEPARTMENT_OTHER)

## 2024-04-28 ENCOUNTER — Emergency Department (HOSPITAL_COMMUNITY)

## 2024-04-28 ENCOUNTER — Encounter (HOSPITAL_BASED_OUTPATIENT_CLINIC_OR_DEPARTMENT_OTHER): Payer: Self-pay | Admitting: Emergency Medicine

## 2024-04-28 DIAGNOSIS — R930 Abnormal findings on diagnostic imaging of skull and head, not elsewhere classified: Secondary | ICD-10-CM

## 2024-04-28 DIAGNOSIS — G510 Bell's palsy: Secondary | ICD-10-CM | POA: Insufficient documentation

## 2024-04-28 LAB — CBC WITH DIFFERENTIAL/PLATELET
Abs Immature Granulocytes: 0.14 K/uL — ABNORMAL HIGH (ref 0.00–0.07)
Basophils Absolute: 0 K/uL (ref 0.0–0.1)
Basophils Relative: 0 %
Eosinophils Absolute: 0 K/uL (ref 0.0–0.5)
Eosinophils Relative: 0 %
HCT: 39 % (ref 39.0–52.0)
Hemoglobin: 12.5 g/dL — ABNORMAL LOW (ref 13.0–17.0)
Immature Granulocytes: 1 %
Lymphocytes Relative: 5 %
Lymphs Abs: 0.9 K/uL (ref 0.7–4.0)
MCH: 28.3 pg (ref 26.0–34.0)
MCHC: 32.1 g/dL (ref 30.0–36.0)
MCV: 88.2 fL (ref 80.0–100.0)
Monocytes Absolute: 0.8 K/uL (ref 0.1–1.0)
Monocytes Relative: 5 %
Neutro Abs: 15.4 K/uL — ABNORMAL HIGH (ref 1.7–7.7)
Neutrophils Relative %: 89 %
Platelets: 245 K/uL (ref 150–400)
RBC: 4.42 MIL/uL (ref 4.22–5.81)
RDW: 15 % (ref 11.5–15.5)
WBC: 17.3 K/uL — ABNORMAL HIGH (ref 4.0–10.5)
nRBC: 0 % (ref 0.0–0.2)

## 2024-04-28 LAB — BASIC METABOLIC PANEL WITH GFR
Anion gap: 15 (ref 5–15)
BUN: 20 mg/dL (ref 6–20)
CO2: 25 mmol/L (ref 22–32)
Calcium: 9.8 mg/dL (ref 8.9–10.3)
Chloride: 102 mmol/L (ref 98–111)
Creatinine, Ser: 1.1 mg/dL (ref 0.61–1.24)
GFR, Estimated: >60 mL/min (ref >60)
Glucose, Bld: 137 mg/dL — ABNORMAL HIGH (ref 70–99)
Potassium: 3.9 mmol/L (ref 3.5–5.1)
Sodium: 142 mmol/L (ref 135–145)

## 2024-04-28 LAB — IRON AND TIBC
Iron: 54 ug/dL (ref 45–182)
Saturation Ratios: 11 % — ABNORMAL LOW (ref 17.9–39.5)
TIBC: 486 ug/dL — ABNORMAL HIGH (ref 250–450)
UIBC: 432 ug/dL

## 2024-04-28 LAB — PROTIME-INR
INR: 1 (ref 0.8–1.2)
Prothrombin Time: 13.6 s (ref 11.4–15.2)

## 2024-04-28 LAB — MAGNESIUM: Magnesium: 2.1 mg/dL (ref 1.7–2.4)

## 2024-04-28 LAB — FERRITIN: Ferritin: 41 ng/mL (ref 24–336)

## 2024-04-28 MED ORDER — IOHEXOL 350 MG/ML SOLN
75.0000 mL | Freq: Once | INTRAVENOUS | Status: AC | PRN
  Administered 2024-04-28: 75 mL via INTRAVENOUS

## 2024-04-28 NOTE — ED Triage Notes (Signed)
 Pt reports he has a hx of Bells Palsy on the right side.  .  Had episode this week reminiscent of this and went to UC and received meds starting Wednesday morning.  Last night left eye began to water.  Went to PCP about an hour ago d/t the left eye changes and some heaviness to right arm they felt he needed work up for stroke/ possible tumor.  Left eye is not closing all the way but is able to blink.

## 2024-04-28 NOTE — ED Provider Notes (Signed)
 Patient sent to the emergency department for MRI to further evaluate for possible stroke.  Patient with several days of Bell's palsy symptoms, seen at urgent care and prescribed appropriate therapy for Bell's palsy.  Today started having weakness in the right arm and became concerned that he was not experiencing Bell's palsy.  MRI has been performed and does not show any acute stroke.  Results for orders placed or performed during the hospital encounter of 04/28/24  CBC with Differential   Collection Time: 04/28/24  5:46 PM  Result Value Ref Range   WBC 17.3 (H) 4.0 - 10.5 K/uL   RBC 4.42 4.22 - 5.81 MIL/uL   Hemoglobin 12.5 (L) 13.0 - 17.0 g/dL   HCT 60.9 60.9 - 47.9 %   MCV 88.2 80.0 - 100.0 fL   MCH 28.3 26.0 - 34.0 pg   MCHC 32.1 30.0 - 36.0 g/dL   RDW 84.9 88.4 - 84.4 %   Platelets 245 150 - 400 K/uL   nRBC 0.0 0.0 - 0.2 %   Neutrophils Relative % 89 %   Neutro Abs 15.4 (H) 1.7 - 7.7 K/uL   Lymphocytes Relative 5 %   Lymphs Abs 0.9 0.7 - 4.0 K/uL   Monocytes Relative 5 %   Monocytes Absolute 0.8 0.1 - 1.0 K/uL   Eosinophils Relative 0 %   Eosinophils Absolute 0.0 0.0 - 0.5 K/uL   Basophils Relative 0 %   Basophils Absolute 0.0 0.0 - 0.1 K/uL   Immature Granulocytes 1 %   Abs Immature Granulocytes 0.14 (H) 0.00 - 0.07 K/uL  Basic metabolic panel   Collection Time: 04/28/24  5:46 PM  Result Value Ref Range   Sodium 142 135 - 145 mmol/L   Potassium 3.9 3.5 - 5.1 mmol/L   Chloride 102 98 - 111 mmol/L   CO2 25 22 - 32 mmol/L   Glucose, Bld 137 (H) 70 - 99 mg/dL   BUN 20 6 - 20 mg/dL   Creatinine, Ser 8.89 0.61 - 1.24 mg/dL   Calcium 9.8 8.9 - 89.6 mg/dL   GFR, Estimated >39 >39 mL/min   Anion gap 15 5 - 15  Magnesium   Collection Time: 04/28/24  5:46 PM  Result Value Ref Range   Magnesium 2.1 1.7 - 2.4 mg/dL  Protime-INR   Collection Time: 04/28/24  6:28 PM  Result Value Ref Range   Prothrombin Time 13.6 11.4 - 15.2 seconds   INR 1.0 0.8 - 1.2   MR BRAIN WO  CONTRAST Result Date: 04/28/2024 EXAM: MRI BRAIN WITHOUT CONTRAST 04/28/2024 10:20:14 PM TECHNIQUE: Multiplanar multisequence MRI of the head/brain was performed without the administration of intravenous contrast. COMPARISON: None available. CLINICAL HISTORY: Neuro deficit, acute, stroke suspected; left facial droop, transient right arm weakness. FINDINGS: BRAIN AND VENTRICLES: No acute infarct. No intracranial hemorrhage. No mass. No midline shift. No hydrocephalus. Minimal nonspecific white matter T2-weighted signal hyperintensities, which may be associated with early chronic small vessel disease or migraine headaches. 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 The sella is unremarkable. Normal flow voids. ORBITS: No acute abnormality. SINUSES AND MASTOIDS: No acute abnormality. BONES AND SOFT TISSUES: Normal marrow signal. No acute soft tissue abnormality. IMPRESSION: 1. No acute intracranial abnormality. 2. 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 3. Minimal nonspecific white matter T2-weighted signal hyperintensities, possibly related to early chronic small vessel disease or migraine headaches. Electronically signed by: Franky Stanford MD 04/28/2024 10:40 PM EST RP Workstation: [id number]   CT ANGIO HEAD  NECK W WO CM Result Date: 04/28/2024 EXAM: CTA HEAD AND NECK WITHOUT AND WITH 04/28/2024 08:20:52 PM TECHNIQUE: CTA of the head and neck was performed without and with the administration of 75 mL of iohexol (OMNIPAQUE) 350 MG/ML injection. Multiplanar 2D and/or 3D reformatted images are provided for review. Automated exposure control, iterative reconstruction, and/or weight based adjustment of the mA/kV was utilized to reduce the radiation dose to as low as reasonably achievable. Stenosis of the internal carotid arteries measured using NASCET criteria. COMPARISON: None available CLINICAL HISTORY: Neuro deficit, acute, stroke suspected; age indeterminate lacunar infarct on CT, left sided  facial paralysis. FINDINGS: CTA NECK: AORTIC ARCH AND ARCH VESSELS: No dissection or arterial injury. No significant stenosis of the brachiocephalic or subclavian arteries. CERVICAL CAROTID ARTERIES: No dissection, arterial injury, or hemodynamically significant stenosis by NASCET criteria. CERVICAL VERTEBRAL ARTERIES: No dissection, arterial injury, or significant stenosis. LUNGS AND MEDIASTINUM: Unremarkable. SOFT TISSUES: No acute abnormality. BONES: No acute abnormality. CTA HEAD: ANTERIOR CIRCULATION: No significant stenosis of the internal carotid arteries. No significant stenosis of the anterior cerebral arteries. No significant stenosis of the middle cerebral arteries. No aneurysm. POSTERIOR CIRCULATION: No significant stenosis of the posterior cerebral arteries. No significant stenosis of the basilar artery. No significant stenosis of the vertebral arteries. No aneurysm. OTHER: No dural venous sinus thrombosis on this non-dedicated study. IMPRESSION: 1. No large vessel occlusion, hemodynamically significant stenosis, or aneurysm in the head or neck. Electronically signed by: Franky Stanford MD 04/28/2024 08:35 PM EST RP Workstation: [id number]   CT Head Wo Contrast Result Date: 04/28/2024 EXAM: CT HEAD WITHOUT 04/28/2024 05:54:00 PM TECHNIQUE: CT of the head was performed without the administration of intravenous contrast. Automated exposure control, iterative reconstruction, and/or weight based adjustment of the mA/kV was utilized to reduce the radiation dose to as low as reasonably achievable. COMPARISON: 02/24/2012. CLINICAL HISTORY: facial droop FINDINGS: BRAIN AND VENTRICLES: No acute intracranial hemorrhage. No mass effect or midline shift. No extra-axial fluid collection. Small right basal ganglia lacunar infarct (coronal image 48), age indeterminate. Encephalomalacic changes in right cerebellar hemisphere. No hydrocephalus. ORBITS: No acute abnormality. SINUSES AND MASTOIDS: No acute abnormality.  SOFT TISSUES AND SKULL: No acute skull fracture. Right suboccipital craniotomy. No acute soft tissue abnormality. IMPRESSION: 1. Small right basal ganglia lacunar infarct, age indeterminate. 2. Encephalomalacic changes in the right cerebellar hemisphere, postsurgical. Electronically signed by: Pinkie Pebbles MD 04/28/2024 06:49 PM EST RP Workstation: [id number]      Haze Lonni PARAS, MD 04/28/24 2313

## 2024-04-28 NOTE — Discharge Instructions (Addendum)
 It was a pleasure caring for you today in the emergency department.

## 2024-04-28 NOTE — ED Notes (Signed)
 Need redraw of blue top. Tube overfilled

## 2024-04-28 NOTE — ED Provider Notes (Addendum)
 [place] [facility] Provider Note  CSN: [id number] Arrival date & time: 04/28/24 1734  Chief Complaint(s) Facial Droop  HPI NUR KRASINSKI is a 53 y.o. male with past medical history as below, significant for hereditary hemorrhagic telangiectasia, Bell's palsy, iron  deficiency anemia, AVM who presents to the ED with complaint of facial droop  Patient reports on Tuesday began having symptoms concerning for possible Bell's palsy, has had Bell's palsy in the past and symptoms feel very similar, difficulty closing his eyelid, facial asymmetry.  He was seen urgent care and started on steroids and acyclovir.  After leaving urgent care began having some weakness to his right arm which lasted for around 24 hours and has since resolved.  Denies any numbness of extremities, no other weakness reported.  No vision or hearing changes, no dysphagia or changes typical swallowing.  No speech disturbance.  No recent falls or head injuries, no hearing changes, no rashes, no facial pain.   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 Authorizing Provider  allopurinol  (ZYLOPRIM ) 300  MG tablet Take 1 tablet (300 mg total) by mouth daily. Patient taking differently: Take 300 mg by mouth every evening. 07/27/23   Barbarann Oneil BROCKS, MD  ferrous sulfate  325 (65 FE) MG tablet Take 1 tablet (325 mg total) by mouth 2 (two) times daily with a meal for 30 days, THEN 1 tablet (325 mg total) daily with breakfast. 11/27/21 01/26/22  Tobie Yetta CHRISTELLA, MD                                                                                                                                    Past Surgical History Past Surgical History:  Procedure Laterality Date   BRAIN SURGERY     COLONOSCOPY WITH PROPOFOL  N/A 11/26/2021   Procedure: COLONOSCOPY WITH PROPOFOL ;  Surgeon: San Sandor GAILS, DO;  Location: [facility] ENDOSCOPY;  Service: Gastroenterology;  Laterality: N/A;   ENTEROSCOPY N/A 12/04/2023   Procedure: ENTEROSCOPY;  Surgeon: Suzann Inocente CHRISTELLA, MD;  Location: [facility] ENDOSCOPY;  Service: Gastroenterology;  Laterality: N/A;   ESOPHAGOGASTRODUODENOSCOPY (EGD) WITH PROPOFOL  N/A 11/26/2021   Procedure:  ESOPHAGOGASTRODUODENOSCOPY (EGD) WITH PROPOFOL ;  Surgeon: San Sandor GAILS, DO;  Location: [facility] ENDOSCOPY;  Service: Gastroenterology;  Laterality: N/A;   HEMOSTASIS CLIP PLACEMENT  11/26/2021   Procedure: HEMOSTASIS CLIP PLACEMENT;  Surgeon: San Sandor GAILS, DO;  Location: [facility] ENDOSCOPY;  Service: Gastroenterology;;   HEMOSTASIS CLIP PLACEMENT  12/04/2023   Procedure: CONTROL OF HEMORRHAGE, GI TRACT, ENDOSCOPIC, BY CLIPPING OR OVERSEWING;  Surgeon: Suzann Inocente HERO, MD;  Location: [facility] ENDOSCOPY;  Service: Gastroenterology;;   HOT HEMOSTASIS N/A 11/26/2021   Procedure: HOT HEMOSTASIS (ARGON PLASMA COAGULATION/BICAP);  Surgeon: San Sandor GAILS, DO;  Location: [facility] ENDOSCOPY;  Service: Gastroenterology;  Laterality: N/A;   HOT HEMOSTASIS N/A 12/04/2023   Procedure: EGD, WITH ARGON PLASMA COAGULATION;  Surgeon: Suzann Inocente HERO, MD;  Location: [facility] ENDOSCOPY;  Service: Gastroenterology;  Laterality: N/A;   LUNG SURGERY      Family History Family History  Problem Relation Age of Onset   Healthy Mother    Healthy Sister    Stroke Maternal Grandfather     Social History Social History[1] Allergies Other, Penicillins, and Sulfa antibiotics  Review of Systems A thorough review of systems was obtained and all systems are negative except as noted in the HPI and PMH.   Physical Exam Vital Signs  I have reviewed the triage vital signs BP (!) 124/90   Pulse 66   Temp 98.2 F (36.8 C) (Oral)   Resp 16   SpO2 96%  Physical Exam Vitals and nursing note reviewed.  Constitutional:      General: He is not in acute distress.    Appearance: Normal appearance. He is well-developed. He is not ill-appearing.  HENT:     Head: Normocephalic and atraumatic.     Right Ear: Tympanic membrane and external ear normal.     Left Ear: Tympanic membrane and external ear normal.     Nose: Nose normal.     Mouth/Throat:     Mouth: Mucous membranes are moist.  Eyes:     General: No scleral icterus.       Right eye: No discharge.        Left eye: No discharge.     Extraocular Movements: Extraocular movements intact.     Pupils: Pupils are equal, round, and reactive to light.  Cardiovascular:     Rate and Rhythm: Normal rate.  Pulmonary:     Effort: Pulmonary effort is normal. No respiratory distress.     Breath sounds: No stridor.  Abdominal:     General: Abdomen is flat. There is no distension.     Tenderness: There is no guarding.  Musculoskeletal:        General: No deformity.     Cervical back: No rigidity.  Skin:    General: Skin is warm and dry.     Coloration: Skin is not cyanotic, jaundiced or pale.     Comments: No facial rash  Neurological:     Mental Status: He is alert and oriented to person, place, and time.     GCS: GCS eye subscore is 4. GCS verbal subscore is 5. GCS motor subscore is 6.     Cranial Nerves: Facial asymmetry present.     Sensory: Sensory deficit present.     Motor: Motor  function is intact.     Coordination: Coordination is intact.     Gait: Gait is intact.     Comments: Strength 5/5 to BLUE/BLLE, equal and symmetric  Exam is consistent with left-sided peripheral facial nerve  paralysis B/l lid lag noted Mildly reduced sensation to V2 on the right   Psychiatric:        Speech: Speech normal.        Behavior: Behavior normal. Behavior is cooperative.     ED Results and Treatments Labs (all labs ordered are listed, but only abnormal results are displayed) Labs Reviewed  CBC WITH DIFFERENTIAL/PLATELET - Abnormal; Notable for the following components:      Result Value   WBC 17.3 (*)    Hemoglobin 12.5 (*)    Neutro Abs 15.4 (*)    Abs Immature Granulocytes 0.14 (*)    All other components within normal limits  BASIC METABOLIC PANEL WITH GFR - Abnormal; Notable for the following components:   Glucose, Bld 137 (*)    All other components within normal limits  PROTIME-INR  MAGNESIUM  FERRITIN  IRON  AND TIBC                                                                                                                          Radiology CT ANGIO HEAD NECK W WO CM Result Date: 04/28/2024 EXAM: CTA HEAD AND NECK WITHOUT AND WITH 04/28/2024 08:20:52 PM TECHNIQUE: CTA of the head and neck was performed without and with the administration of 75 mL of iohexol (OMNIPAQUE) 350 MG/ML injection. Multiplanar 2D and/or 3D reformatted images are provided for review. Automated exposure control, iterative reconstruction, and/or weight based adjustment of the mA/kV was utilized to reduce the radiation dose to as low as reasonably achievable. Stenosis of the internal carotid arteries measured using NASCET criteria. COMPARISON: None available CLINICAL HISTORY: Neuro deficit, acute, stroke suspected; age indeterminate lacunar infarct on CT, left sided facial paralysis. FINDINGS: CTA NECK: AORTIC ARCH AND ARCH VESSELS: No dissection or arterial injury. No significant  stenosis of the brachiocephalic or subclavian arteries. CERVICAL CAROTID ARTERIES: No dissection, arterial injury, or hemodynamically significant stenosis by NASCET criteria. CERVICAL VERTEBRAL ARTERIES: No dissection, arterial injury, or significant stenosis. LUNGS AND MEDIASTINUM: Unremarkable. SOFT TISSUES: No acute abnormality. BONES: No acute abnormality. CTA HEAD: ANTERIOR CIRCULATION: No significant stenosis of the internal carotid arteries. No significant stenosis of the anterior cerebral arteries. No significant stenosis of the middle cerebral arteries. No aneurysm. POSTERIOR CIRCULATION: No significant stenosis of the posterior cerebral arteries. No significant stenosis of the basilar artery. No significant stenosis of the vertebral arteries. No aneurysm. OTHER: No dural venous sinus thrombosis on this non-dedicated study. IMPRESSION: 1. No large vessel occlusion, hemodynamically significant stenosis, or aneurysm in the head or neck. Electronically signed by: Franky Stanford MD 04/28/2024 08:35 PM EST RP Workstation: [id number]   CT Head Wo Contrast Result Date: 04/28/2024 EXAM: CT HEAD WITHOUT 04/28/2024 05:54:00 PM TECHNIQUE: CT of the head was performed without the administration of intravenous contrast. Automated exposure control, iterative reconstruction, and/or weight based adjustment of the mA/kV was utilized to reduce the radiation dose to as low as reasonably achievable. COMPARISON: 02/24/2012. CLINICAL HISTORY: facial droop FINDINGS: BRAIN  AND VENTRICLES: No acute intracranial hemorrhage. No mass effect or midline shift. No extra-axial fluid collection. Small right basal ganglia lacunar infarct (coronal image 48), age indeterminate. Encephalomalacic changes in right cerebellar hemisphere. No hydrocephalus. ORBITS: No acute abnormality. SINUSES AND MASTOIDS: No acute abnormality. SOFT TISSUES AND SKULL: No acute skull fracture. Right suboccipital craniotomy. No acute soft tissue abnormality.  IMPRESSION: 1. Small right basal ganglia lacunar infarct, age indeterminate. 2. Encephalomalacic changes in the right cerebellar hemisphere, postsurgical. Electronically signed by: Pinkie Pebbles MD 04/28/2024 06:49 PM EST RP Workstation: [id number]    Pertinent labs & imaging results that were available during my care of the patient were reviewed by me and considered in my medical decision making (see MDM for details).  Medications Ordered in ED Medications  iohexol (OMNIPAQUE) 350 MG/ML injection 75 mL (75 mLs Intravenous Contrast Given 04/28/24 2014)                                                                                                                                     Procedures Procedures  (including critical care time)  Medical Decision Making / ED Course    Medical Decision Making:    NAVDEEP HALT is a 53 y.o. male with past medical history as below, significant for Oteri hemorrhagic telangiectasia, Bell's palsy, iron  deficiency anemia, AVM who presents to the ED with complaint of facial droop. The complaint involves an extensive differential diagnosis and also carries with it a high risk of complications and morbidity.  Serious etiology was considered. Ddx includes but is not limited to: Bell's palsy, CVA, zoster, tickborne illness, CNS tumor, cerebral aneurysm, etc  Complete initial physical exam performed, notably the patient was in NAD.    Reviewed and confirmed nursing documentation for past medical history, family history, social history.  Vital signs reviewed.    Facial paralysis> - Exam is concerning for peripheral left-sided facial paralysis - He was started on steroids and acyclovir by urgent care earlier in the week - He had transient right arm weakness which has since resolved, first noticed on Tuesday, lasted until Wednesday evening- now resolved - He has mild reduced sensation to V2 on the right - EOMI - CT head with small right sided  age-indeterminate lacunar infarct - Will get CTA given history of vascular abnormalities > this was negative - will send to [facility] for MRI to eval for possible CVA - spoke w/ Dr Darra at [facility] - pt/family agreeable with the plan  Clinical Course as of 04/28/24 2059  Fri Apr 28, 2024  1820 WBC(!): 17.3 Recent steroid use [SG]    Clinical Course User Index [SG] Elnor Jayson LABOR, DO                    Additional history obtained: -Additional history obtained from family -External records from outside source obtained and reviewed including: Chart review including previous notes, labs, imaging, consultation notes  including  Prior labs, prior hematology documentation, prior admission   Lab Tests: -I ordered, reviewed, and interpreted labs.   The pertinent results include:   Labs Reviewed  CBC WITH DIFFERENTIAL/PLATELET - Abnormal; Notable for the following components:      Result Value   WBC 17.3 (*)    Hemoglobin 12.5 (*)    Neutro Abs 15.4 (*)    Abs Immature Granulocytes 0.14 (*)    All other components within normal limits  BASIC METABOLIC PANEL WITH GFR - Abnormal; Notable for the following components:   Glucose, Bld 137 (*)    All other components within normal limits  PROTIME-INR  MAGNESIUM  FERRITIN  IRON  AND TIBC    Notable for leukocytosis  EKG   EKG Interpretation Date/Time:    Ventricular Rate:    PR Interval:    QRS Duration:    QT Interval:    QTC Calculation:   R Axis:      Text Interpretation:           Imaging Studies ordered: I ordered imaging studies including CT head, CTA head and neck, MRI brain> MRI is pending I independently visualized the following imaging with scope of interpretation limited to determining acute life threatening conditions related to emergency care; findings noted above I agree with the radiologist interpretation If any imaging was obtained with contrast I closely monitored patient for any possible adverse  reaction a/w contrast administration in the emergency department   Medicines ordered and prescription drug management: Meds ordered this encounter  Medications   iohexol (OMNIPAQUE) 350 MG/ML injection 75 mL    -I have reviewed the patients home medicines and have made adjustments as needed   Consultations Obtained: Not applicable  Cardiac Monitoring: Continuous pulse oximetry interpreted by myself, 97% on RA.    Social Determinants of Health:  Diagnosis or treatment significantly limited by social determinants of health: no pcp   Reevaluation: After the interventions noted above, I reevaluated the patient and found that they have stayed the same  Co morbidities that complicate the patient evaluation  Past Medical History:  Diagnosis Date   HHT (hereditary hemorrhagic telangiectasia)       Dispostion: Disposition decision including need for hospitalization was considered, and patient transferred to [facility] for MRI    Final Clinical Impression(s) / ED Diagnoses Final diagnoses:  Facial nerve paralysis        Elnor Jayson LABOR, DO 04/28/24 2051     [1]  Social History Tobacco Use   Smoking status: Never   Smokeless tobacco: Never  Vaping Use   Vaping status: Never Used  Substance Use Topics   Alcohol use: No   Drug use: No     Elnor Jayson LABOR, DO 04/28/24 2059

## 2024-04-28 NOTE — ED Notes (Signed)
 Pt informed to go directly to [facility] for further evaluation.  Pt and pts visitor at bedside verbalized understanding, agreeable to plan.  Pt ambulatory to ED exit with visitor, without assistance.
